# Patient Record
Sex: Female | Born: 1960 | Race: White | Hispanic: No | Marital: Married | Smoking: Never smoker
Health system: Southern US, Community
[De-identification: ages and names within clinical notes are randomized; demographics above are authoritative.]

## PROBLEM LIST (undated history)

## (undated) DIAGNOSIS — N816 Rectocele: Secondary | ICD-10-CM

## (undated) DIAGNOSIS — Z973 Presence of spectacles and contact lenses: Secondary | ICD-10-CM

## (undated) DIAGNOSIS — N200 Calculus of kidney: Secondary | ICD-10-CM

## (undated) DIAGNOSIS — I471 Supraventricular tachycardia: Secondary | ICD-10-CM

## (undated) DIAGNOSIS — K5790 Diverticulosis of intestine, part unspecified, without perforation or abscess without bleeding: Secondary | ICD-10-CM

## (undated) DIAGNOSIS — Z87442 Personal history of urinary calculi: Secondary | ICD-10-CM

## (undated) DIAGNOSIS — H35371 Puckering of macula, right eye: Secondary | ICD-10-CM

## (undated) DIAGNOSIS — E063 Autoimmune thyroiditis: Secondary | ICD-10-CM

## (undated) DIAGNOSIS — E039 Hypothyroidism, unspecified: Secondary | ICD-10-CM

## (undated) DIAGNOSIS — I10 Essential (primary) hypertension: Secondary | ICD-10-CM

## (undated) DIAGNOSIS — Z8619 Personal history of other infectious and parasitic diseases: Secondary | ICD-10-CM

## (undated) DIAGNOSIS — N814 Uterovaginal prolapse, unspecified: Secondary | ICD-10-CM

## (undated) DIAGNOSIS — R000 Tachycardia, unspecified: Secondary | ICD-10-CM

## (undated) DIAGNOSIS — H18839 Recurrent erosion of cornea, unspecified eye: Secondary | ICD-10-CM

## (undated) DIAGNOSIS — E785 Hyperlipidemia, unspecified: Secondary | ICD-10-CM

## (undated) DIAGNOSIS — I341 Nonrheumatic mitral (valve) prolapse: Secondary | ICD-10-CM

## (undated) DIAGNOSIS — H93A9 Pulsatile tinnitus, unspecified ear: Secondary | ICD-10-CM

## (undated) DIAGNOSIS — Z87448 Personal history of other diseases of urinary system: Secondary | ICD-10-CM

## (undated) DIAGNOSIS — I700 Atherosclerosis of aorta: Secondary | ICD-10-CM

## (undated) HISTORY — DX: Personal history of other diseases of urinary system: Z87.448

## (undated) HISTORY — DX: Hypothyroidism, unspecified: E03.9

## (undated) HISTORY — DX: Calculus of kidney: N20.0

## (undated) HISTORY — DX: Hyperlipidemia, unspecified: E78.5

## (undated) HISTORY — DX: Nonrheumatic mitral (valve) prolapse: I34.1

## (undated) HISTORY — DX: Autoimmune thyroiditis: E06.3

## (undated) HISTORY — PX: COLONOSCOPY: SHX174

## (undated) HISTORY — DX: Rectocele: N81.6

## (undated) HISTORY — DX: Personal history of other infectious and parasitic diseases: Z86.19

## (undated) HISTORY — DX: Uterovaginal prolapse, unspecified: N81.4

## (undated) HISTORY — DX: Supraventricular tachycardia: I47.1

## (undated) HISTORY — DX: Diverticulosis of intestine, part unspecified, without perforation or abscess without bleeding: K57.90

---
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
## 1979-08-19 HISTORY — PX: TONSILLECTOMY: SUR1361

## 1994-08-18 HISTORY — PX: BREAST BIOPSY: SHX20

## 1998-10-24 ENCOUNTER — Other Ambulatory Visit: Admission: RE | Admit: 1998-10-24 | Discharge: 1998-10-24 | Payer: Self-pay | Admitting: Obstetrics and Gynecology

## 1999-11-26 ENCOUNTER — Other Ambulatory Visit: Admission: RE | Admit: 1999-11-26 | Discharge: 1999-11-26 | Payer: Self-pay | Admitting: Obstetrics and Gynecology

## 2001-01-01 ENCOUNTER — Other Ambulatory Visit: Admission: RE | Admit: 2001-01-01 | Discharge: 2001-01-01 | Payer: Self-pay | Admitting: Obstetrics and Gynecology

## 2001-05-03 ENCOUNTER — Encounter: Payer: Self-pay | Admitting: Obstetrics and Gynecology

## 2001-05-03 ENCOUNTER — Ambulatory Visit (HOSPITAL_COMMUNITY): Admission: RE | Admit: 2001-05-03 | Discharge: 2001-05-03 | Payer: Self-pay | Admitting: Obstetrics and Gynecology

## 2002-07-12 ENCOUNTER — Encounter: Payer: Self-pay | Admitting: Obstetrics and Gynecology

## 2002-07-12 ENCOUNTER — Ambulatory Visit (HOSPITAL_COMMUNITY): Admission: RE | Admit: 2002-07-12 | Discharge: 2002-07-12 | Payer: Self-pay | Admitting: Obstetrics and Gynecology

## 2002-07-25 ENCOUNTER — Other Ambulatory Visit: Admission: RE | Admit: 2002-07-25 | Discharge: 2002-07-25 | Payer: Self-pay | Admitting: Obstetrics and Gynecology

## 2004-01-29 ENCOUNTER — Other Ambulatory Visit: Admission: RE | Admit: 2004-01-29 | Discharge: 2004-01-29 | Payer: Self-pay | Admitting: Obstetrics and Gynecology

## 2004-03-01 ENCOUNTER — Ambulatory Visit (HOSPITAL_COMMUNITY): Admission: RE | Admit: 2004-03-01 | Discharge: 2004-03-01 | Payer: Self-pay | Admitting: Obstetrics and Gynecology

## 2005-03-19 ENCOUNTER — Other Ambulatory Visit: Admission: RE | Admit: 2005-03-19 | Discharge: 2005-03-19 | Payer: Self-pay | Admitting: Obstetrics and Gynecology

## 2005-05-13 ENCOUNTER — Ambulatory Visit (HOSPITAL_COMMUNITY): Admission: RE | Admit: 2005-05-13 | Discharge: 2005-05-13 | Payer: Self-pay | Admitting: Obstetrics and Gynecology

## 2005-05-16 ENCOUNTER — Encounter: Admission: RE | Admit: 2005-05-16 | Discharge: 2005-05-16 | Payer: Self-pay | Admitting: Obstetrics and Gynecology

## 2005-12-17 ENCOUNTER — Encounter: Admission: RE | Admit: 2005-12-17 | Discharge: 2005-12-17 | Payer: Self-pay | Admitting: Obstetrics and Gynecology

## 2008-02-04 ENCOUNTER — Ambulatory Visit (HOSPITAL_BASED_OUTPATIENT_CLINIC_OR_DEPARTMENT_OTHER): Admission: RE | Admit: 2008-02-04 | Discharge: 2008-02-04 | Payer: Self-pay | Admitting: Obstetrics and Gynecology

## 2009-03-27 ENCOUNTER — Encounter (INDEPENDENT_AMBULATORY_CARE_PROVIDER_SITE_OTHER): Payer: Self-pay | Admitting: *Deleted

## 2009-05-30 ENCOUNTER — Encounter: Admission: RE | Admit: 2009-05-30 | Discharge: 2009-05-30 | Payer: Self-pay | Admitting: Gynecology

## 2009-09-07 ENCOUNTER — Telehealth: Payer: Self-pay | Admitting: Gastroenterology

## 2010-08-18 HISTORY — PX: CHOLECYSTECTOMY: SHX55

## 2010-09-17 NOTE — Progress Notes (Signed)
Summary: Schedule Colonoscopy  Phone Note Outgoing Call   Call placed by: Hortense Ramal CMA Duncan Dull),  September 07, 2009 2:14 PM Call placed to: Patient Summary of Call: I have left a message for patient to call back. She is due for her recall colonoscopy due to age. Her last colonoscopy was done in 2002. Initial call taken by: Hortense Ramal CMA Duncan Dull),  September 07, 2009 2:15 PM  Follow-up for Phone Call        Phone busy x 2. Will call back. Hortense Ramal CMA Duncan Dull)  September 11, 2009 10:31 AM   Patient called back and told Vallarie Mare, [facility] that she will not be having her colonoscopy until she is 50 years old. She was advised that Dr Russella Dar recommends that she have procedure now. However, she still declines to have procedure done at this time. Follow-up by: Hortense Ramal CMA Duncan Dull),  September 12, 2009 11:32 AM

## 2011-01-08 ENCOUNTER — Ambulatory Visit: Payer: Self-pay | Admitting: Gastroenterology

## 2011-02-12 ENCOUNTER — Ambulatory Visit (INDEPENDENT_AMBULATORY_CARE_PROVIDER_SITE_OTHER): Payer: Managed Care, Other (non HMO) | Admitting: Gastroenterology

## 2011-02-12 ENCOUNTER — Encounter: Payer: Self-pay | Admitting: Gastroenterology

## 2011-02-12 VITALS — BP 122/74 | HR 88 | Ht 62.0 in | Wt 120.0 lb

## 2011-02-12 DIAGNOSIS — K921 Melena: Secondary | ICD-10-CM

## 2011-02-12 DIAGNOSIS — R197 Diarrhea, unspecified: Secondary | ICD-10-CM

## 2011-02-12 MED ORDER — CLOTRIMAZOLE 1 % EX CREA: TOPICAL_CREAM | Freq: Two times a day (BID) | CUTANEOUS | Status: AC

## 2011-02-12 MED ORDER — PEG-KCL-NACL-NASULF-NA ASC-C 100 G PO SOLR: 1.0000 | Freq: Once | ORAL | Status: DC

## 2011-02-12 NOTE — Progress Notes (Signed)
History of Present Illness: This is a 50 -year-old female with 2 months of intermittent mucous per rectum, rectal bleeding and rectal itching and irritation that generally occurs while walking. Has had intermittent diarrhea for 2 weeks. Does not note BRBPR with bowel movements. She has noted a perianal rash for several weeks it is recently improved. Her rectal bleeding and irritation has also recently improved. She has a rectocele being followed by her gynecologist.  Review of Systems: Pertinent positive and negative review of systems were noted in the above HPI section. All other review of systems were otherwise negative.  Current Medications, Allergies, Past Medical History, Past Surgical History, Family History and Social History were reviewed in [employer] record.  Physical Exam: General: Well developed , well nourished, no acute distress Head: Normocephalic and atraumatic Eyes:  sclerae anicteric, EOMI Ears: Normal auditory acuity Mouth: No deformity or lesions Neck: Supple, no masses or thyromegaly Lungs: Clear throughout to auscultation Heart: Regular rate and rhythm; no murmurs, rubs or bruits Abdomen: Soft, non tender and non distended. No masses, hepatosplenomegaly or hernias noted. Normal Bowel sounds Rectal: perianal erythematous rash c/w candida, DRE shows no lesions and Hemoccult negative stool Musculoskeletal: Symmetrical with no gross deformities  Skin: No lesions on visible extremities Pulses:  Normal pulses noted Extremities: No clubbing, cyanosis, edema or deformities noted Neurological: Alert oriented x 4, grossly nonfocal Cervical Nodes:  No significant cervical adenopathy Inguinal Nodes: No significant inguinal adenopathy Psychological:  Alert and cooperative. Normal mood and affect  Assessment and Recommendations:  1. Perianal dermatitis is the likely cause of her rectal irritation and bleeding. I suspect this is candida. Begin Lotrimin for  10 days. I have advised her to discontinue all other creams and ointments and to leave this area dry. Consider the use of Desitin when necessary. Need to rule out proctitis, colorectal neoplasms, hemorrhoids and other disorders. She is due for a 10 year screening colonoscopy at this time anyway. Schedule colonoscopy. The risks, benefits, and alternatives to colonoscopy with possible biopsy and possible polypectomy were discussed with the patient and they consent to proceed.   2. Rectocele. I do not feel that her current symptoms symptoms related to this problem.

## 2011-02-12 NOTE — Patient Instructions (Signed)
You have been scheduled for a Colonoscopy. Separate instructions given. Pick up your prep from your pharmacy also a prescription for Lotrimin cream has been sent to the pharmacy.  Stop using other creams and lotions rectally.

## 2011-02-13 ENCOUNTER — Encounter: Payer: Self-pay | Admitting: Gastroenterology

## 2011-03-03 ENCOUNTER — Telehealth: Payer: Self-pay | Admitting: Gastroenterology

## 2011-03-03 NOTE — Telephone Encounter (Signed)
Pt transferred to [facility]

## 2011-04-01 ENCOUNTER — Other Ambulatory Visit: Payer: Managed Care, Other (non HMO) | Admitting: Gastroenterology

## 2012-09-08 ENCOUNTER — Other Ambulatory Visit: Payer: Self-pay | Admitting: Obstetrics and Gynecology

## 2012-09-08 ENCOUNTER — Ambulatory Visit
Admission: RE | Admit: 2012-09-08 | Discharge: 2012-09-08 | Disposition: A | Discharge status: Home / Self Care | Payer: Managed Care, Other (non HMO) | Source: Ambulatory Visit | Attending: Obstetrics and Gynecology | Admitting: Obstetrics and Gynecology

## 2012-09-08 DIAGNOSIS — N644 Mastodynia: Secondary | ICD-10-CM

## 2012-09-26 ENCOUNTER — Encounter (HOSPITAL_BASED_OUTPATIENT_CLINIC_OR_DEPARTMENT_OTHER): Payer: Self-pay | Admitting: Emergency Medicine

## 2012-09-26 ENCOUNTER — Emergency Department (HOSPITAL_BASED_OUTPATIENT_CLINIC_OR_DEPARTMENT_OTHER)
Admission: EM | Admit: 2012-09-26 | Discharge: 2012-09-26 | Disposition: A | Discharge status: Home / Self Care | Payer: Managed Care, Other (non HMO) | Attending: Emergency Medicine | Admitting: Emergency Medicine

## 2012-09-26 DIAGNOSIS — E039 Hypothyroidism, unspecified: Secondary | ICD-10-CM | POA: Insufficient documentation

## 2012-09-26 DIAGNOSIS — I10 Essential (primary) hypertension: Secondary | ICD-10-CM | POA: Insufficient documentation

## 2012-09-26 DIAGNOSIS — Z79899 Other long term (current) drug therapy: Secondary | ICD-10-CM | POA: Insufficient documentation

## 2012-09-26 DIAGNOSIS — Z8719 Personal history of other diseases of the digestive system: Secondary | ICD-10-CM | POA: Insufficient documentation

## 2012-09-26 DIAGNOSIS — Z8619 Personal history of other infectious and parasitic diseases: Secondary | ICD-10-CM | POA: Insufficient documentation

## 2012-09-26 DIAGNOSIS — R0602 Shortness of breath: Secondary | ICD-10-CM | POA: Insufficient documentation

## 2012-09-26 DIAGNOSIS — E785 Hyperlipidemia, unspecified: Secondary | ICD-10-CM | POA: Insufficient documentation

## 2012-09-26 DIAGNOSIS — R079 Chest pain, unspecified: Secondary | ICD-10-CM | POA: Insufficient documentation

## 2012-09-26 DIAGNOSIS — R002 Palpitations: Secondary | ICD-10-CM

## 2012-09-26 DIAGNOSIS — R000 Tachycardia, unspecified: Secondary | ICD-10-CM | POA: Insufficient documentation

## 2012-09-26 DIAGNOSIS — Z862 Personal history of diseases of the blood and blood-forming organs and certain disorders involving the immune mechanism: Secondary | ICD-10-CM | POA: Insufficient documentation

## 2012-09-26 HISTORY — DX: Tachycardia, unspecified: R00.0

## 2012-09-26 HISTORY — DX: Essential (primary) hypertension: I10

## 2012-09-26 LAB — CBC WITH DIFFERENTIAL/PLATELET
Basophils Absolute: 0 10*3/uL (ref 0.0–0.1)
Eosinophils Relative: 0 % (ref 0–5)
Lymphocytes Relative: 17 % (ref 12–46)
Lymphs Abs: 1.2 10*3/uL (ref 0.7–4.0)
MCHC: 34.1 g/dL (ref 30.0–36.0)
Platelets: 183 10*3/uL (ref 150–400)
RBC: 5.1 MIL/uL (ref 3.87–5.11)
RDW: 12.8 % (ref 11.5–15.5)

## 2012-09-26 LAB — COMPREHENSIVE METABOLIC PANEL
AST: 26 U/L (ref 0–37)
Albumin: 4.5 g/dL (ref 3.5–5.2)
BUN: 7 mg/dL (ref 6–23)
CO2: 28 mEq/L (ref 19–32)
Creatinine, Ser: 0.5 mg/dL (ref 0.50–1.10)
GFR calc Af Amer: >90 mL/min (ref >90)
Sodium: 143 mEq/L (ref 135–145)
Total Bilirubin: 0.5 mg/dL (ref 0.3–1.2)

## 2012-09-26 LAB — TROPONIN I: Troponin I: <0.3 ng/mL (ref <0.30)

## 2012-09-26 MED ORDER — SODIUM CHLORIDE 0.9 % IV BOLUS (SEPSIS)
1000.0000 mL | Freq: Once | INTRAVENOUS | Status: AC
  Administered 2012-09-26: 1000 mL via INTRAVENOUS

## 2012-09-26 NOTE — ED Provider Notes (Signed)
History     CSN: [id number]  Arrival date & time 09/26/12  1327   First MD Initiated Contact with Patient 09/26/12 1459      Chief Complaint  Patient presents with  . Anxiety    (Consider location/radiation/quality/duration/timing/severity/associated sxs/prior treatment) The history is provided by the patient.  Katie Brewer is a 52 y.o. female history of hypothyroidism, anemia, hypertension, tachycardia here presenting with increased tachycardia and anxiety and shortness of breath. She says she intermittently has tachycardia for several years but over the last to 3 days it's been constant. She also has intermittent shortness of breath that's getting worse but is not exertional. She had left-sided chest pain 2 weeks ago and went to [facility] and was diagnosed with shingles and finished a course of Valtrex. No cardiac risk factors and no PE risk factors. She has been decreasing her dose of Synthroid thinking that it is causing her tachycardia.   Past Medical History  Diagnosis Date  . Hypothyroidism   . Hyperlipidemia   . Iron deficiency anemia   . Hemorrhoids   . Hypertension   . Tachycardia     Past Surgical History  Procedure Laterality Date  . Appendectomy    . Tonsillectomy      Family History  Problem Relation Age of Onset  . Diabetes type I Son   . Diabetes type II Father     and mother   . Heart disease Father   . Colon cancer Neg Hx   . Colon polyps Father   . Ovarian cancer Mother     History  Substance Use Topics  . Smoking status: Never Smoker   . Smokeless tobacco: Never Used  . Alcohol Use: No    OB History   Grav Para Term Preterm Abortions TAB SAB Ect Mult Living                  Review of Systems  Respiratory: Positive for shortness of breath.   Cardiovascular: Positive for palpitations.  All other systems reviewed and are negative.    Allergies  Demerol; Erythromycin; Penicillins; and Sulfa drugs cross reactors  Home Medications    Current Outpatient Rx  Name  Route  Sig  Dispense  Refill  . ARMOUR THYROID 60 MG tablet      Take as directed          . Cholecalciferol (VITAMIN D3) 1000 UNITS tablet   Oral   Take 1,000 Units by mouth daily.           . Coconut Oil OIL   Rectal   Place rectally. As directed          . Multiple Vitamins-Minerals (EYE VITAMINS PO)   Oral   Take by mouth daily. Complex tabs          . peg 3350 powder (MOVIPREP) 100 G SOLR   Oral   Take 1 kit (100 g total) by mouth once.   1 kit   0     BP 149/94  Temp(Src) 98.7 F (37.1 C) (Oral)  Resp 16  Ht 5\' 3"  (1.6 m)  Wt 120 lb (54.432 kg)  BMI 21.26 kg/m2  SpO2 97%  Physical Exam  Nursing note and vitals reviewed. Constitutional: She is oriented to person, place, and time. She appears well-developed.  Slightly anxious   HENT:  Head: Normocephalic.  Mouth/Throat: Oropharynx is clear and moist.  Eyes: Conjunctivae are normal. Pupils are equal, round, and reactive to light.  Neck: Normal range of motion. Neck supple.  Cardiovascular: Regular rhythm and normal heart sounds.   tachy  Pulmonary/Chest: Effort normal and breath sounds normal. No respiratory distress. She has no wheezes. She has no rales.  Abdominal: Soft. Bowel sounds are normal. She exhibits no distension. There is no tenderness. There is no rebound.  Musculoskeletal: Normal range of motion. She exhibits no edema and no tenderness.  L breast no vesicles, nontender   Neurological: She is alert and oriented to person, place, and time.  Skin: Skin is warm and dry.  Psychiatric: She has a normal mood and affect. Her behavior is normal. Judgment and thought content normal.    ED Course  Procedures (including critical care time)  Labs Reviewed  CBC WITH DIFFERENTIAL - Abnormal; Notable for the following:    Hemoglobin 15.2 (*)    All other components within normal limits  COMPREHENSIVE METABOLIC PANEL - Abnormal; Notable for the following:     Glucose, Bld 102 (*)    All other components within normal limits  TROPONIN I  D-DIMER, QUANTITATIVE   No results found.   No diagnosis found.   Date: 09/26/2012  Rate: 108  Rhythm: sinus tachycardia  QRS Axis: normal  Intervals: normal  ST/T Wave abnormalities: normal  Conduction Disutrbances:none  Narrative Interpretation:   Old EKG Reviewed: none available    MDM  Katie Brewer is a 52 y.o. female here with tachycardia, SOB. Consider anxiety vs anemia vs PE. Will get labs and d-dimer. She had CXR 2 weeks ago that was nl and doesn't want another xray but she is not having symptoms of pneumonia.   4:15 PM D-dimer nl. Not anemic. Trop neg x 1 and symptoms for more than a day. She doesn't appear anxious. Tachycardia improved (from 111 to 105 on d/c). I told her to get her TSH checked with her doctor to adjust her armour thyroid dose.   Richardean Canal, MD 09/26/12 [phone]

## 2012-09-26 NOTE — ED Notes (Signed)
Per EMS:  Pt called regarding anxiety and shaking.  Recently told by MD with shingles and tachycardia.  No acute distress noted.

## 2012-12-09 ENCOUNTER — Other Ambulatory Visit (INDEPENDENT_AMBULATORY_CARE_PROVIDER_SITE_OTHER): Payer: Managed Care, Other (non HMO) | Admitting: *Deleted

## 2012-12-09 DIAGNOSIS — R42 Dizziness and giddiness: Secondary | ICD-10-CM

## 2012-12-10 ENCOUNTER — Encounter: Payer: Self-pay | Admitting: Cardiology

## 2013-05-18 ENCOUNTER — Encounter: Payer: Self-pay | Admitting: Internal Medicine

## 2013-05-18 ENCOUNTER — Ambulatory Visit (INDEPENDENT_AMBULATORY_CARE_PROVIDER_SITE_OTHER): Payer: Managed Care, Other (non HMO) | Admitting: Internal Medicine

## 2013-05-18 VITALS — BP 150/90 | HR 114 | Resp 16 | Ht 63.0 in | Wt 125.0 lb

## 2013-05-18 DIAGNOSIS — Z780 Asymptomatic menopausal state: Secondary | ICD-10-CM

## 2013-05-18 DIAGNOSIS — N951 Menopausal and female climacteric states: Secondary | ICD-10-CM

## 2013-05-18 DIAGNOSIS — R000 Tachycardia, unspecified: Secondary | ICD-10-CM | POA: Insufficient documentation

## 2013-05-18 DIAGNOSIS — I10 Essential (primary) hypertension: Secondary | ICD-10-CM

## 2013-05-18 DIAGNOSIS — E063 Autoimmune thyroiditis: Secondary | ICD-10-CM | POA: Insufficient documentation

## 2013-05-18 NOTE — Patient Instructions (Addendum)
See me in 2-3 weeks

## 2013-05-18 NOTE — Progress Notes (Signed)
Subjective:    Patient ID: Katie Brewer, female    DOB: 04-29-1961, 52 y.o.   MRN: [id number]  HPI  Katie Brewer is here as a new pt for first visit. She is here for a consultation.   [facility] [place] who she wishes to keep  PMH of hashimotos thyroiditis diagnosed 10 years ago, menopause, and  Tachycardia syndrome.  She is here for an opinion about her tachycardia.  She experienced first episode in Jan 2014 during what she was told was shingles.  She says she had rapid palpitations and had surges of episodic high blood pressure.  She went to see Dr. Donnie Aho in the spring who per her report did and ECHO and a HOlter and was told she had benign tachycardia.  She was given metroprolol but could not tolerate daily dose as her BP dropped to low.   Tachycardia episodes at times associated with flushing,  No recurrent rash,  No diarrhea.  Her blood pressure will sometimes elevate during the tachycardia but not always  She was wondering if her symptoms were due to menopause and her primary MD placed her on progesterone creme which she has been using for the past 2 months.  Progesterone has not helped any of her symptoms.  Her LMP was in October of 2010.  She really does not describe vasomotor flushes   Allergies  Allergen Reactions  . Demerol   . Erythromycin   . Penicillins   . Sulfa Drugs Cross Reactors    Past Medical History  Diagnosis Date  . Hypothyroidism   . Hyperlipidemia   . Iron deficiency anemia   . Hemorrhoids   . Hypertension   . Tachycardia    Past Surgical History  Procedure Laterality Date  . Appendectomy    . Tonsillectomy    . Hymenotomy / hymenectomy     History   Social History  . Marital Status: Married    Spouse Name: N/A    Number of Children: 2  . Years of Education: N/A   Occupational History  . Homemaker/Teacher    Social History Main Topics  . Smoking status: Never Smoker   . Smokeless tobacco: Never  Used  . Alcohol Use: No  . Drug Use: No  . Sexual Activity: Not on file   Other Topics Concern  . Not on file   Social History Narrative   Caffeine daily--tea    Family History  Problem Relation Age of Onset  . Diabetes type I Son   . Diabetes type II Father     and mother   . Heart disease Father   . Colon cancer Neg Hx   . Colon polyps Father   . Ovarian cancer Mother    There are no active problems to display for this patient.  Current Outpatient Prescriptions on File Prior to Visit  Medication Sig Dispense Refill  . ARMOUR THYROID 60 MG tablet Take as directed       . Cholecalciferol (VITAMIN D3) 1000 UNITS tablet Take 1,000 Units by mouth daily.        . Coconut Oil OIL Place rectally. As directed       . Multiple Vitamins-Minerals (EYE VITAMINS PO) Take by mouth daily. Complex tabs       . peg 3350 powder (MOVIPREP) 100 G SOLR Take 1 kit (100 g total) by mouth once.  1 kit  0   No current facility-administered medications on file prior to  visit.      Review of Systems See HPI    Objective:   Physical Exam  Physical Exam  Nursing note and vitals reviewed.  Constitutional: She is oriented to person, place, and time. She appears well-developed and well-nourished.  HENT:  Head: Normocephalic and atraumatic.  Cardiovascular: Normal rate and regular rhythm. Exam reveals no gallop and no friction rub.  No murmur heard.  Pulmonary/Chest: Breath sounds normal. She has no wheezes. She has no rales.  Neurological: She is alert and oriented to person, place, and time.  Skin: Skin is warm and dry.  Psychiatric: She has a normal mood and affect. Her behavior is normal.       Assessment & Plan:  S/S complex  Tachycardia, episodic Blood pressure elevation,  Rare flushing  I counseled pt that she is clearly post menopausal but I do not believe that HT will help her symptoms.  She really does not describe vasomotor flushing.  Will check aldosterone and plasma  metanephrines and catecholamines..  Check free thyroid levels.    ADvised her to take low dose metroprolol  (she can tolerate 1/4 tablet)   as her tachycardia now occurs daily   See me in 2-3 weeks

## 2013-05-19 LAB — COMPREHENSIVE METABOLIC PANEL
CO2: 33 mEq/L — ABNORMAL HIGH (ref 19–32)
Chloride: 101 mEq/L (ref 96–112)
Creat: 0.68 mg/dL (ref 0.50–1.10)
Glucose, Bld: 129 mg/dL — ABNORMAL HIGH (ref 70–99)
Potassium: 4 mEq/L (ref 3.5–5.3)
Total Protein: 7.2 g/dL (ref 6.0–8.3)

## 2013-05-19 LAB — T4, FREE: Free T4: 0.82 ng/dL (ref 0.80–1.80)

## 2013-05-19 LAB — T3, FREE: T3, Free: 3.4 pg/mL (ref 2.3–4.2)

## 2013-05-21 LAB — METANEPHRINES, PLASMA
Metanephrine, Free: 29 pg/mL (ref <=57)
Normetanephrine, Free: 136 pg/mL (ref <=148)

## 2013-05-23 ENCOUNTER — Encounter: Payer: Self-pay | Admitting: *Deleted

## 2013-05-24 LAB — CATECHOLAMINES, FRACTIONATED, PLASMA
Catecholamines, Total: 616 pg/mL
Epinephrine: 64 pg/mL
Norepinephrine: 552 pg/mL

## 2013-05-24 LAB — ALDOSTERONE: Aldosterone, Serum: 2 ng/dL

## 2013-05-25 ENCOUNTER — Encounter: Payer: Self-pay | Admitting: *Deleted

## 2013-06-06 ENCOUNTER — Ambulatory Visit (INDEPENDENT_AMBULATORY_CARE_PROVIDER_SITE_OTHER): Payer: Managed Care, Other (non HMO) | Admitting: Internal Medicine

## 2013-06-06 ENCOUNTER — Encounter: Payer: Self-pay | Admitting: Internal Medicine

## 2013-06-06 VITALS — BP 134/88 | HR 106 | Temp 97.1°F | Resp 16 | Wt 126.0 lb

## 2013-06-06 DIAGNOSIS — E063 Autoimmune thyroiditis: Secondary | ICD-10-CM

## 2013-06-06 DIAGNOSIS — Z780 Asymptomatic menopausal state: Secondary | ICD-10-CM

## 2013-06-06 DIAGNOSIS — N951 Menopausal and female climacteric states: Secondary | ICD-10-CM

## 2013-06-06 DIAGNOSIS — R000 Tachycardia, unspecified: Secondary | ICD-10-CM

## 2013-06-06 NOTE — Progress Notes (Signed)
Subjective:    Patient ID: Katie Brewer, female    DOB: June 19, 1961, 52 y.o.   MRN: [id number]  HPI  [name]  Is here for follow up  She attempted to take 1/4 dose of her metoprolol that was recommended by Dr. Donnie Aho but states she could not tolerate  It made her ankle swell.  She brings a two page list of her blood pressures taken at multiple times during the day.  By far the majority of the time her BP is normal  . She had only 4 episodes  Where her SBP was slightly over 140.    She has not taken her progesterone creme and she tells me it did not help her anyway.  All labs are normal with normal aldosterone     She seems most concerned about her heart "pounding in my ear" .  She asks if she should take progesterone cream as it "helped her sleep"     Allergies  Allergen Reactions  . Demerol   . Erythromycin   . Penicillins   . Sulfa Drugs Cross Reactors   . Thimerosal    Past Medical History  Diagnosis Date  . Hypothyroidism   . Hyperlipidemia   . Iron deficiency anemia   . Hemorrhoids   . Hypertension   . Tachycardia    Past Surgical History  Procedure Laterality Date  . Appendectomy    . Tonsillectomy    . Hymenotomy / hymenectomy     History   Social History  . Marital Status: Married    Spouse Name: N/A    Number of Children: 2  . Years of Education: N/A   Occupational History  . Homemaker/Teacher    Social History Main Topics  . Smoking status: Never Smoker   . Smokeless tobacco: Never Used  . Alcohol Use: No  . Drug Use: No  . Sexual Activity: Not on file   Other Topics Concern  . Not on file   Social History Narrative   Caffeine daily--tea    Family History  Problem Relation Age of Onset  . Diabetes type I Son   . Diabetes type II Father     and mother   . Heart disease Father   . Colon cancer Neg Hx   . Colon polyps Father   . Ovarian cancer Mother    Patient Active Problem List   Diagnosis Date Noted  . Tachycardia,  unspecified 05/18/2013  . Menopause 05/18/2013  . Hashimoto's thyroiditis 05/18/2013   Current Outpatient Prescriptions on File Prior to Visit  Medication Sig Dispense Refill  . ARMOUR THYROID 60 MG tablet Take as directed       . Cholecalciferol (VITAMIN D3) 1000 UNITS tablet Take 1,000 Units by mouth daily.        Marland Kitchen co-enzyme Q-10 30 MG capsule Place 60 mg under the tongue 3 (three) times daily.      . Inositol POWD 500 mg by Does not apply route at bedtime.      . Magnesium 300 MG CAPS Take 300 mg by mouth.      . Melatonin 5 MG SUBL Place 5 mg under the tongue.      . Misc Natural Products (DEEP SLEEP) LIQD Take 30 drops by mouth at bedtime.      . chlorpheniramine-HYDROcodone (TUSSIONEX) 10-8 MG/5ML LQCR       . Coconut Oil OIL Place rectally. As directed       . UNABLE TO FIND Apply 20  mg topically at bedtime. Bioidentical Progesterone Cream       No current facility-administered medications on file prior to visit.      Review of Systems See HPI    Objective:   Physical Exam Physical Exam  Nursing note and vitals reviewed.  Constitutional: She is oriented to person, place, and time. She appears well-developed and well-nourished.  HENT:  Head: Normocephalic and atraumatic.  Cardiovascular: Normal rate and regular rhythm. Exam reveals no gallop and no friction rub.  No murmur heard.  Pulmonary/Chest: Breath sounds normal. She has no wheezes. She has no rales.  Neurological: She is alert and oriented to person, place, and time.  Skin: Skin is warm and dry.  Psychiatric: She has a normal mood and affect. Her behavior is normal.             Assessment & Plan:  Subjective  Tachycardia  Pounding in ears  I advised she is to discuss with primary MD about progesterone treatment but in my opinion  Progesterone is not indicated for insomnia.   I also advised she may want a second opinion from cardiology at a tertiary care center.  She is to discuss with [facility]  I will be happy to see her as needed for any further consultation

## 2013-06-06 NOTE — Patient Instructions (Signed)
See me as needed  Discuss referral with your primary MD

## 2014-01-24 DIAGNOSIS — I341 Nonrheumatic mitral (valve) prolapse: Secondary | ICD-10-CM | POA: Insufficient documentation

## 2014-01-24 DIAGNOSIS — R002 Palpitations: Secondary | ICD-10-CM | POA: Insufficient documentation

## 2014-07-07 ENCOUNTER — Other Ambulatory Visit: Payer: Self-pay

## 2014-07-07 DIAGNOSIS — Z1231 Encounter for screening mammogram for malignant neoplasm of breast: Secondary | ICD-10-CM

## 2014-07-17 ENCOUNTER — Ambulatory Visit
Admission: RE | Admit: 2014-07-17 | Discharge: 2014-07-17 | Disposition: A | Discharge status: Home / Self Care | Payer: Managed Care, Other (non HMO) | Source: Ambulatory Visit

## 2014-07-17 DIAGNOSIS — Z1231 Encounter for screening mammogram for malignant neoplasm of breast: Secondary | ICD-10-CM

## 2015-06-27 DIAGNOSIS — M7502 Adhesive capsulitis of left shoulder: Secondary | ICD-10-CM | POA: Insufficient documentation

## 2015-06-27 DIAGNOSIS — M25819 Other specified joint disorders, unspecified shoulder: Secondary | ICD-10-CM | POA: Insufficient documentation

## 2015-06-27 DIAGNOSIS — M7540 Impingement syndrome of unspecified shoulder: Secondary | ICD-10-CM | POA: Insufficient documentation

## 2015-06-27 DIAGNOSIS — M19012 Primary osteoarthritis, left shoulder: Secondary | ICD-10-CM | POA: Insufficient documentation

## 2015-09-25 ENCOUNTER — Encounter: Payer: Self-pay | Admitting: Cardiology

## 2016-04-10 ENCOUNTER — Encounter: Payer: Self-pay | Admitting: Genetic Counselor

## 2016-04-10 ENCOUNTER — Telehealth: Payer: Self-pay | Admitting: Genetic Counselor

## 2016-04-10 NOTE — Telephone Encounter (Signed)
Pt returned by call to reschedule genetic appt. to 10/5, completed intake, mailed new pt letter

## 2016-05-19 ENCOUNTER — Telehealth: Payer: Self-pay | Admitting: Genetic Counselor

## 2016-05-19 ENCOUNTER — Encounter: Payer: Self-pay | Admitting: Genetic Counselor

## 2016-05-19 NOTE — Telephone Encounter (Signed)
Lauren informed me that a letter was sent by the patient and brought down for Kayla on 05/16/2016.  We were out of the office that day.  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  I left a VM on the patient's phone indicating who I was, that Lonn Georgia was out of the office on Friday and today, she will be back tomorrow but in clinic.  I left my phone number, as well as Kayla's direct line so that she may call and speak to [place] in person.

## 2016-05-19 NOTE — Telephone Encounter (Signed)
Discussed her note.  Explained our GC process.  Patients come in to discuss their personal and family history of cancer.  Based on this history will determine whether they meet medical criteria for genetic testing. Those who meet criteria will be offered genetic testing and sent for labs.  IF they decline testing then the lab will be cancelled.  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  IF this was tested outside of our office and through [facility], it may or may not be difficult to learn if they have reclassified this variant.  If it was through other labs, it is typically easier to learn about reclassification of variants.  We discussed that I don't know how well each insurance carrier covers genetic counseling.  She has [employer] which should cover genetic counseling.  We charge around $120/30 minute session and therefore she could be billed $240. Explained that all results are given over phone, unless an alternate way is discussed ahead of time.  If she is positive, we typically bring her back in for genetic counseling.  If she has a VUS or is negative, then we report over the phone.  She can get a copy of the result if she wishes. Discussed insurance discrimination, and that the chance at this time is low based on GINA and the ACA. However, if either the ACA or GINA is changed, then there could be higher risks.  That said, many insurance companies will cover testing because they prefer prevention rather than covering cancer treatments.  Patient voiced her understanding and will plan on coming in on Thursday.

## 2016-05-21 ENCOUNTER — Other Ambulatory Visit: Payer: Managed Care, Other (non HMO)

## 2016-05-21 ENCOUNTER — Encounter: Payer: Managed Care, Other (non HMO) | Admitting: Genetic Counselor

## 2016-05-22 ENCOUNTER — Ambulatory Visit (HOSPITAL_BASED_OUTPATIENT_CLINIC_OR_DEPARTMENT_OTHER): Payer: Managed Care, Other (non HMO) | Admitting: Genetic Counselor

## 2016-05-22 ENCOUNTER — Encounter: Payer: Self-pay | Admitting: Oncology

## 2016-05-22 ENCOUNTER — Other Ambulatory Visit: Payer: Managed Care, Other (non HMO)

## 2016-05-22 DIAGNOSIS — Z8043 Family history of malignant neoplasm of testis: Secondary | ICD-10-CM

## 2016-05-22 DIAGNOSIS — Z803 Family history of malignant neoplasm of breast: Secondary | ICD-10-CM | POA: Diagnosis not present

## 2016-05-22 DIAGNOSIS — Z801 Family history of malignant neoplasm of trachea, bronchus and lung: Secondary | ICD-10-CM

## 2016-05-22 DIAGNOSIS — Z8041 Family history of malignant neoplasm of ovary: Secondary | ICD-10-CM

## 2016-05-22 DIAGNOSIS — Z808 Family history of malignant neoplasm of other organs or systems: Secondary | ICD-10-CM

## 2016-05-22 DIAGNOSIS — Z800 Family history of malignant neoplasm of digestive organs: Secondary | ICD-10-CM | POA: Diagnosis not present

## 2016-05-22 DIAGNOSIS — Z85828 Personal history of other malignant neoplasm of skin: Secondary | ICD-10-CM

## 2016-05-22 DIAGNOSIS — Z315 Encounter for genetic counseling: Secondary | ICD-10-CM

## 2016-05-22 DIAGNOSIS — Z8489 Family history of other specified conditions: Secondary | ICD-10-CM

## 2016-05-23 ENCOUNTER — Encounter: Payer: Self-pay | Admitting: Genetic Counselor

## 2016-05-23 DIAGNOSIS — Z803 Family history of malignant neoplasm of breast: Secondary | ICD-10-CM | POA: Insufficient documentation

## 2016-05-23 DIAGNOSIS — Z8041 Family history of malignant neoplasm of ovary: Secondary | ICD-10-CM | POA: Insufficient documentation

## 2016-05-23 NOTE — Progress Notes (Signed)
REFERRING PROVIDER: Arvella Nigh, MD  PRIMARY PROVIDER:  Mylinda Latina, MD  PRIMARY REASON FOR VISIT:  1. Family history of ovarian cancer   2. Family history of breast cancer in female   3. Family history- stomach cancer   4. Family history of neuroblastoma   5. Family history of lung cancer   6. History of skin cancer   7. Family history of nonmelanoma skin cancer   8. Family history of testicular cancer      HISTORY OF PRESENT ILLNESS:   Ms. Rozas, a 55 y.o. female, was seen for a [place] cancer genetics consultation at the request of Dr. Radene Knee due to a family history of ovarian, breast, and other cancers.  Ms. Poirier presents to clinic today with her husband to discuss the possibility of a hereditary predisposition to cancer, genetic testing, and to further clarify her future cancer risks, as well as potential cancer risks for family members.    Ms. Bevilacqua had genetic testing of the BRCA1/2 genes (sequencing w/ 5-site rearrangement analysis) in 2012-2013 through [facility] and this test found a variant of uncertain significance (VUS) called "c.1075A>G (N319S)" in one copy of the BRCA1 gene.  Testing was otherwise negative for any known pathogenic mutations and any additional VUSes.  Ms. Polka is also hoping to learn whether or not any updates have been made to this test result.  Ms. Nachtigal has a personal history of basal and squamous cell carcinomas which she attributes to her history of sun exposure.  She sees a dermatologist regularly.  She has had no other personal history of cancer.   HORMONAL RISK FACTORS:  Menarche was at age 69.  First live birth at age 23.  OCP use for approximately 2 months.  Ovaries intact: yes.  Hysterectomy: no.  Menopausal status: postmenopausal.  HRT use: takes very low dose of natural hormone that she refers to as 'bioidentical pellets'; has taken these for 4-5 years. Colonoscopy: yes, in 2012; normal; is on a 10-year  schedule Mammogram within the last year: gets every 2 years for fear of radiation exposure. Number of breast biopsies: 1 - history of benign lumpectomy at the age of 15 (fibroadenoma) Up to date with pelvic exams:  yes. Any excessive radiation exposure/other exposures in the past:  Reports some history of secondhand smoke exposure  Past Medical History:  Diagnosis Date  . Hemorrhoids   . Hyperlipidemia   . Hypertension   . Hypothyroidism   . Iron deficiency anemia   . Tachycardia     Past Surgical History:  Procedure Laterality Date  . APPENDECTOMY    . HYMENOTOMY / HYMENECTOMY    . TONSILLECTOMY      Social History   Social History  . Marital status: Married    Spouse name: N/A  . Number of children: 2  . Years of education: N/A   Occupational History  . Homemaker/Teacher    Social History Main Topics  . Smoking status: Never Smoker  . Smokeless tobacco: Never Used  . Alcohol use No  . Drug use: No  . Sexual activity: Not Asked   Other Topics Concern  . None   Social History Narrative   Caffeine daily--tea      FAMILY HISTORY:  We obtained a detailed, 4-generation family history.  Significant diagnoses are listed below: Family History  Problem Relation Age of Onset  . Diabetes type I Son   . Diabetes type II Father     and mother   .  Heart disease Father   . High blood pressure Father   . Skin cancer Father     non-melanoma  . Ovarian cancer Mother 74  . Skin cancer Mother     non-melanoma  . Renal cancer Sister     died of neuroblastoma at age 10-11y; diagnosed 1 year before  . Breast cancer Maternal Aunt 69  . Lung cancer Maternal Uncle     not a smoker; worked around tobacco  . Stomach cancer Paternal Uncle 78  . Testicular cancer Paternal Uncle     dx. 30s  . Heart attack Maternal Grandfather     d. 70s  . Kidney Stones Paternal Grandmother     had to have a kidney removed  . Stroke Paternal Grandfather     d. 54  . Heart attack  Paternal Grandfather     smoker  . Other Daughter     gluten sensitivity  . Heart disease Maternal Uncle     d. 70s  . Breast cancer Cousin     maternal 1st cousin dx breast cancer at 61-62; w/ mets  . Colon cancer Neg Hx     Ms. Riggle has one son and one daughter, ages 25 and 22.  She has one full sister who was diagnosed with a neuroblastoma and passed away approximately one year later at the age of 10-11.  She has one full brother who is currently 44 and who has never had cancer.  Ms. Hardiman's mother was diagnosed with ovarian cancer at 74 and she passed away at 75.  She also had a history of non-melanoma skin cancer.  Ms. Brugger's father is currently 81 and he has a history of non-melanoma skin cancer, as well as diabetes, heart disease, and high blood pressure.   Ms. Grieb's mother had two full brothers and one full sister.  Her sister was diagnosed with breast cancer at 69 and she passed away from dementia-related causes in her 80s.  She had two daughter, one of whom was recently diagnosed with metastatic breast cancer at the age of 61-62.  One brother died of heart disease in his 70s.  The other brother died of lung cancer in his 80s.  Ms. Diantonio has limited contact with some of her cousins.  Her maternal grandmother died at 93.  She had a sister who had a history of stomach cancer, diagnosed at a later age in life.  Ms. Ahlin's maternal grandfather died of a heart attack in his 70s.  She has no further information for other maternal great aunts/uncles and great grandparents.    Ms. Bayliss's father has one full brother who was diagnosed with stomach cancer at 78.  He was also diagnosed with testicular cancer in his 30s.  He has one son who has never been diagnosed with cancer.  Ms. Daigler's paternal grandmother died at 89 and never had cancer.  Many of her siblings passed away from COPD, stroke, or heart-related causes.  Ms. Ceniceros's paternal grandfather was a smoker and he died of a stroke  and heart attack at 54.    Ms. Garbett is unaware of any family history of genetic testing for hereditary cancer risks.  Patient's maternal ancestors are of English descent, and paternal ancestors are of English and Dutch descent. There is no reported Ashkenazi Jewish ancestry. There is no known consanguinity.  GENETIC COUNSELING ASSESSMENT: Kiah H Trethewey is a 55 y.o. female with a family history of ovarian and breast cancer which is somewhat   suggestive of a hereditary cancer syndrome and predisposition to cancer. We, therefore, discussed and recommended the following at today's visit.   DISCUSSION: We reviewed the characteristics, features and inheritance patterns of hereditary cancer syndromes, particularly those caused by mutations within the PALB2, CHEK2, BRIP1, and Lynch syndrome genes. We also discussed genetic testing, including the appropriate family members to test, the process of testing, insurance coverage and turn-around-time for results. We discussed the implications of a negative, positive and/or variant of uncertain significant result. We recommended Ms. Nadara Mustard pursue genetic testing for the 28-gene myRisk Hereditary Cancer Panel through [facility].  The [facility] gene panel offered by [facility] includes sequencing and deletion/duplication testing of the following 28 genes: APC, ATM, BARD1, BMPR1A, BRCA1, BRCA2, BRIP1, CHD1, CDK4, CDKN2A, CHEK2, EPCAM (large rearrangement only), GREM1/SCG5, MLH1, MSH2, MSH6, MUTYH, NBN, PALB2, PMS2, POLD1, POLE, PTEN, RAD51C, RAD51D, SMAD4, STK11, and TP53.   I contacted [facility] to determine whether they have updated Ms. Mccay's VUS, and they have not done so yet.  To date, they have only seen it eight times.  Previously, [facility] had offered to test Ms. Wiltsie's father for free to determine whether this VUS was inherited from him or from Ms. Brouillard's mother (who has passed away).  Ms. Barriere reports  that her father would be interested in this testing if it's free.  Thus, I will follow up with [facility] to see if they are still willing to offer him this testing free of charge and I will get back in touch with Ms. Pergola.  Based on Ms. Roache's family history of cancer, she meets medical criteria for updated genetic testing. Despite that she meets criteria, she may still have an out of pocket cost. We discussed that if her out of pocket cost for testing is over $100, the laboratory will call and confirm whether she wants to proceed with testing.  If the out of pocket cost of testing is less than $100 she will be billed by the genetic testing laboratory.   Based on the patient's personal and family history, a statistical model (IBIS/Tyrer-Cuzick)  and literature data were used to estimate her risk of developing breast cancer. This estimates her lifetime risk of developing breast cancer to be approximately 17.8%. This estimation does not take into account any genetic testing results.  The patient's lifetime breast cancer risk is a preliminary estimate based on available information using one of several models endorsed by the [place] (ACS). The ACS recommends consideration of breast MRI screening as an adjunct to mammography for patients at high risk (defined as 20% or greater lifetime risk). A more detailed breast cancer risk assessment can be considered, if clinically indicated.   PLAN: After considering the risks, benefits, and limitations, Ms. Nadara Mustard  provided informed consent to pursue genetic testing and the blood sample was sent to [facility] for analysis of the 28-gene [facility] Hereditary Cancer Panel. Results should be available within approximately 2-3 weeks' time, at which point they will be disclosed by telephone to Ms. Kneebone, as will any additional recommendations warranted by these results. Ms. Castleman will receive a summary of her genetic counseling visit and a copy  of her results once available. This information will also be available in Epic. We encouraged Ms. Blasdel to remain in contact with cancer genetics annually so that we can continuously update the family history and inform her of any changes in cancer genetics and testing that may be of benefit for  her family. Ms. Estrella's questions were answered to her satisfaction today. Our contact information was provided should additional questions or concerns arise.  Thank you for the referral and allowing us to share in the care of your patient.    , MS, CGC Certified Genetic Counselor [email or web address] Phone: [phone]  The patient was seen for a total of 60 minutes in face-to-face genetic counseling.  This patient was discussed with Drs. Magrinat, Gudena and/or Feng who agrees with the above.    _______________________________________________________________________ For Office Staff:  Number of people involved in session: 2 Was an Intern/ student involved with case: no   

## 2016-05-26 ENCOUNTER — Telehealth: Payer: Self-pay | Admitting: Genetic Counselor

## 2016-05-27 NOTE — Telephone Encounter (Signed)
Verified that the testing lab will call Katie Brewer if her OOP cost for genetic testing will be more than $100.  Also verified that the lab will still offer free family studies to her father to see if he also has this BRCA1 VUS. This has to be done by December 5th.  We can get him set up to do that at his doctor's office or he can set-up a blood draw here.  Katie Brewer let me know that her father is coming to visit in a couple of weeks, so she will find out specific date and would like to get him set up for a blood draw here at [facility] while he is in [place].  She will call me back.

## 2016-06-02 ENCOUNTER — Telehealth: Payer: Self-pay | Admitting: Genetic Counselor

## 2016-06-02 NOTE — Telephone Encounter (Signed)
[facility] is offering free single site testing to Ms. Casaus's father.  He wants to proceed and will be in [place] on Friday, October 20th and would like to have his blood drawn at [facility].  Ms. Kelly called to set up this appt, but he has not yet been seen in our system.  I gave her the scheduler's [facility]) phone number, so that she can call and get that set-up.  I will make sure to have a test kit in the lab that day.

## 2016-06-03 ENCOUNTER — Telehealth: Payer: Self-pay | Admitting: Genetic Counselor

## 2016-06-03 NOTE — Telephone Encounter (Signed)
Lt mess regarding scheduling father's lab appointment

## 2016-06-05 ENCOUNTER — Telehealth: Payer: Self-pay | Admitting: Genetic Counselor

## 2016-06-05 NOTE — Telephone Encounter (Incomplete)
Pt confirmed appt,

## 2016-06-06 ENCOUNTER — Other Ambulatory Visit: Payer: Managed Care, Other (non HMO)

## 2016-06-11 ENCOUNTER — Telehealth: Payer: Self-pay | Admitting: Genetic Counselor

## 2016-06-13 NOTE — Telephone Encounter (Signed)
Ms. Katie Brewer has a copy of her test results per her request, but would like to wait to discuss these in more detail until after we get the results from her father's family studies testing.  This makes absolute sense, and we will wait to discuss this information until after we get those results.

## 2016-06-18 ENCOUNTER — Encounter: Payer: Self-pay | Admitting: *Deleted

## 2016-06-19 ENCOUNTER — Encounter: Payer: Self-pay | Admitting: Cardiology

## 2016-06-30 NOTE — Telephone Encounter (Signed)
Discussed with Ms. Gowen that her genetic test results were negative for known pathogenic mutations within any of the 28 genes on the myRisk Hereditary Cancer Panel through [facility].  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 No additional VUSes were found.  We also had ordered family studies of Ms. Cargle's father to determine whether or not he had this same VUS, as his family of cancer was the less concerning.  We discussed that he was found to have this same BRCA1 gene VUS.  While this is more reassuring for us, [facility] still does not have quite enough information to update this result.  Ms. Trumbo should discuss this new information with her doctor.  She is welcome to call with any questions.  She would like a copy of her results mailed and I am happy to do that. 

## 2016-07-03 ENCOUNTER — Encounter: Payer: Self-pay | Admitting: Cardiology

## 2016-07-03 ENCOUNTER — Ambulatory Visit (INDEPENDENT_AMBULATORY_CARE_PROVIDER_SITE_OTHER): Payer: Managed Care, Other (non HMO) | Admitting: Cardiology

## 2016-07-03 VITALS — BP 124/86 | HR 101 | Ht 63.0 in | Wt 115.0 lb

## 2016-07-03 DIAGNOSIS — R002 Palpitations: Secondary | ICD-10-CM | POA: Diagnosis not present

## 2016-07-03 DIAGNOSIS — I341 Nonrheumatic mitral (valve) prolapse: Secondary | ICD-10-CM | POA: Diagnosis not present

## 2016-07-03 DIAGNOSIS — R000 Tachycardia, unspecified: Secondary | ICD-10-CM

## 2016-07-03 DIAGNOSIS — E782 Mixed hyperlipidemia: Secondary | ICD-10-CM | POA: Diagnosis not present

## 2016-07-03 NOTE — Progress Notes (Signed)
Cardiology Office Note    Date:  07/03/2016   ID:  Katie Spanglelizabeth H Mcwright, DOB Jan 24, 1961, MRN [id number]  PCP:  Jeanann LewandowskyBOZEMAN, Suzzane, MD  Cardiologist:  Tobias AlexanderKatarina Allisson Schindel, MD   Chief Complaint  Patient presents with  . New Patient (Initial Visit)    History of Present Illness:  Katie Brewer is a 55 y.o. female who is coming for evaluation of palpitations and elevated heart rate. In 2014 she was evaluated by Dr. Donnie Ahoilley, and underwent a Holter monitoring that showed inappropriate sinus tachycardia, she was started on metoprolol that made her tired with no improvement of symptoms. Her symptoms started several years ago, she states that ever since she had thyroid problem her heart rate tends to be elevated, she wears aFitBit and her baseline heart rate is always in upper 80s and increases in exercise and with stress, especially emotional stress in her heart rate jumped to 120-130 and remains in that level for about an hour before it slows down. She also had carotid duplex performed in 2014 there was completely normal. She had an echocardiogram done in 2014 with normal systolic function LVEF 60%, normal biatrial size, normal right ventricular size and function, mild mitral valve prolapse with mild regurgitation. Mild tricuspid regurgitation. Her thyroid issue is long-standing, she wasn't able to tolerate Synthroid and is currently taking Armour , she has frequent checkups for TSH the 3 and T4 with frequent adjustment of her dose that are influencing her heart rate. Her most recent TSH 7.2. She denies any dizziness shortness of breath or chest pain with episodes of palpitations. She experienced one episode of chest pain in August when she woke up from sleep with chest pressure that resolved on its own. She walks 6 times a week for 2-3 miles and he experiences no chest pain or shortness of breath. Family history is significant for coronary artery disease in her father in his early 4560s that resulted into  bypass surgery. She feels that her palpitations got worse after menopause other than anxiety she doesn't have any associated symptoms, no prior syncope. She lives extremely healthy lifestyle with LC diet, almost no carbohydrates high level of exercise. She takes it clear that she doesn't like to take any medicines. Her most recent cholesterol was HDL 75 triglyceride 68 and LDL to 129.  Past Medical History:  Diagnosis Date  . Diverticulosis   . Hemorrhoids   . History of chicken pox   . History of cystocele   . Hyperlipidemia   . Hypertension   . Hypothyroidism   . Iron deficiency anemia   . MVP (mitral valve prolapse)   . Rectocele   . SVT (supraventricular tachycardia) (HCC)   . Tachycardia   . Uterine prolapse     Past Surgical History:  Procedure Laterality Date  . APPENDECTOMY  1976  . BREAST BIOPSY Left 1996  . CHOLECYSTECTOMY  2012  . HYMENOTOMY / HYMENECTOMY    . TONSILLECTOMY  1981   Current Medications: Outpatient Medications Prior to Visit  Medication Sig Dispense Refill  . ARMOUR THYROID 60 MG tablet Take as directed     . Cholecalciferol (VITAMIN D3) 1000 UNITS tablet Take 1,000 Units by mouth daily.      . Coconut Oil OIL Place rectally. As directed     . chlorpheniramine-HYDROcodone (TUSSIONEX) 10-8 MG/5ML LQCR     . co-enzyme Q-10 30 MG capsule Place 60 mg under the tongue 3 (three) times daily.    . Inositol POWD 500 mg  by Does not apply route at bedtime.    . Magnesium 300 MG CAPS Take 300 mg by mouth.    . Melatonin 5 MG SUBL Place 5 mg under the tongue.    . Misc Natural Products (DEEP SLEEP) LIQD Take 30 drops by mouth at bedtime.    Marland Kitchen UNABLE TO FIND Apply 20 mg topically at bedtime. Bioidentical Progesterone Cream     No facility-administered medications prior to visit.      Allergies:   Meperidine; Shellfish allergy; Ampicillin; Demerol; Erythromycin; Influenza vaccines; Penicillins; Sulfa antibiotics; Sulfa drugs cross reactors; and Thimerosal    Social History   Social History  . Marital status: Married    Spouse name: N/A  . Number of children: 2  . Years of education: N/A   Occupational History  . Homemaker/Teacher    Social History Main Topics  . Smoking status: Never Smoker  . Smokeless tobacco: Never Used  . Alcohol use No  . Drug use: No  . Sexual activity: Not Asked   Other Topics Concern  . None   Social History Narrative   Caffeine daily--tea      Family History:  The patient's family history includes Breast cancer in her cousin; Breast cancer (age of onset: 67) in her maternal aunt; Diabetes type I in her son; Diabetes type II in her father; Heart attack in her maternal grandfather and paternal grandfather; Heart disease in her father and maternal uncle; High blood pressure in her father; Kidney Stones in her paternal grandmother; Lung cancer in her maternal uncle; Other in her daughter; Ovarian cancer (age of onset: 65) in her mother; Renal cancer in her sister; Skin cancer in her father and mother; Stomach cancer (age of onset: 67) in her paternal uncle; Stroke in her paternal grandfather; Testicular cancer in her paternal uncle.   ROS:   Please see the history of present illness.    ROS All other systems reviewed and are negative.   PHYSICAL EXAM:   VS:  BP 124/86 (BP Location: Right Arm, Patient Position: Sitting, Cuff Size: Normal)   Pulse (!) 101   Ht 5\' 3"  (1.6 m)   Wt 115 lb (52.2 kg)   SpO2 99%   BMI 20.37 kg/m    GEN: Well nourished, well developed, in no acute distress  HEENT: normal  Neck: no JVD, carotid bruits, or masses Cardiac: RRR; 2/6 systolic murmurs, rubs, or gallops,no edema  Respiratory:  clear to auscultation bilaterally, normal work of breathing GI: soft, nontender, nondistended, + BS MS: no deformity or atrophy  Skin: warm and dry, no rash Neuro:  Alert and Oriented x 3, Strength and sensation are intact Psych: euthymic mood, full affect  Wt Readings from Last 3  Encounters:  07/03/16 115 lb (52.2 kg)  06/06/13 126 lb (57.2 kg)  05/18/13 125 lb (56.7 kg)      Studies/Labs Reviewed:   EKG:  EKG is ordered today.  The ekg ordered today demonstrates Normal sinus rhythm with ventricular rate 96 bpm, normal EKG.  Recent Labs: No results found for requested labs within last 8760 hours.   Lipid Panel No results found for: CHOL, TRIG, HDL, CHOLHDL, VLDL, LDLCALC, LDLDIRECT  Additional studies/ records that were reviewed today include:  See history of present illness   ASSESSMENT:    1. MVP (mitral valve prolapse)   2. Palpitations   3. Inappropriate sinus tachycardia   4. Mixed hyperlipidemia     PLAN:  In order of problems listed  above:  1. Inappropriate sinus tachycardia, the patient is encouraged to continue exercising, make sure that she stays plenty hydrated every day and more when she is exercising. She is encouraged to use all electrolyte supplements. She is off work Cardiz[employer] however she makes it clear that she is not interested in taking meds as her symptoms are not significant and she can deal with them if she knows that they are not harmful. 2. Mitral all prolapse with only mild mitral regurgitation, she has minimal systolic murmur, there is no reason to repeat echocardiogram right now we'll just follow. 3. Hyperlipidemia and family history of coronary artery disease, she is already on ideal diet and exercise regimen, she is advised to start using red yeast rice 600 mg by mouth daily.  Medication Adjustments/Labs and Tests Ordered: Current medicines are reviewed at length with the patient today.  Concerns regarding medicines are outlined above.  Medication changes, Labs and Tests ordered today are listed in the Patient Instructions below. Patient Instructions  .Medication Instructions:   Your physician recommends that you continue on your current medications as directed. Please refer to the Current Medication list given to you  today.    Follow-Up:  Your physician wants you to follow-up in: ONE YEAR WITH DR Johnell ComingsNELSON You will receive a reminder letter in the mail two months in advance. If you don't receive a letter, please call our office to schedule the follow-up appointment.        If you need a refill on your cardiac medications before your next appointment, please call your pharmacy.      Signed, Tobias AlexanderKatarina Koa Zoeller, MD  07/03/2016 5:30 PM    [facility] [address], [place], [place]  [address] Phone: [phone](336) [phone]; Fax: [phone](336) [phone]

## 2016-07-03 NOTE — Patient Instructions (Signed)

## 2017-01-28 ENCOUNTER — Telehealth: Payer: Self-pay | Admitting: Cardiology

## 2017-01-28 NOTE — Telephone Encounter (Signed)
New Message     Pt states she is having erratic heartbeat after she exercise, she is feeling dizzy weird when this happens, like her heart cant slow down once it gets beating real fast

## 2017-01-28 NOTE — Telephone Encounter (Signed)
Pt calling to schedule an appt with Dr Delton SeeNelson, for complaints of feeling her heart "skipping beats" post exercise.  Pt states she exercises daily and within the last 3 weeks, has felt her heart rate/rhythm skipping beats.  Pt states she dizzy mild dizziness with this, and feels "funny." Pt states that once she's completely in her resting phase, she goes back to her normal baseline.  Scheduled the pt to come in and see Dr Delton SeeNelson on this Friday 6/15 at 0940.  Pt aware to arrive 15 mins prior to this appt.  Advised the pt to hold off on exercise, until she see's Dr Delton SeeNelson on this Friday.  Pt verbalized understanding and agrees with this plan.  Will send this message to Dr Delton SeeNelson as an Lorain ChildesFYI.

## 2017-01-30 ENCOUNTER — Encounter: Payer: Self-pay | Admitting: Cardiology

## 2017-01-30 ENCOUNTER — Ambulatory Visit (INDEPENDENT_AMBULATORY_CARE_PROVIDER_SITE_OTHER): Payer: Managed Care, Other (non HMO) | Admitting: Cardiology

## 2017-01-30 VITALS — BP 132/72 | HR 96 | Ht 63.0 in | Wt 109.0 lb

## 2017-01-30 DIAGNOSIS — R002 Palpitations: Secondary | ICD-10-CM | POA: Diagnosis not present

## 2017-01-30 DIAGNOSIS — R42 Dizziness and giddiness: Secondary | ICD-10-CM

## 2017-01-30 DIAGNOSIS — E063 Autoimmune thyroiditis: Secondary | ICD-10-CM

## 2017-01-30 DIAGNOSIS — R000 Tachycardia, unspecified: Secondary | ICD-10-CM

## 2017-01-30 DIAGNOSIS — I4711 Inappropriate sinus tachycardia, so stated: Secondary | ICD-10-CM

## 2017-01-30 NOTE — Progress Notes (Signed)
Cardiology Office Note    Date:  01/30/2017   ID:  Katie Brewer, DOB 1960/10/23, MRN [id number]  PCP:  Jeanann Lewandowsky, MD  Cardiologist:  Tobias Alexander, MD   Chief complaint: Symptomatic palpitations, dizziness.  History of Present Illness:  Katie Brewer is a 56 y.o. female who is coming for evaluation of palpitations and elevated heart rate. In 2014 she was evaluated by Dr. Donnie Aho, and underwent a Holter monitoring that showed inappropriate sinus tachycardia, she was started on metoprolol that made her tired with no improvement of symptoms. Her symptoms started several years ago, she states that ever since she had thyroid problem her heart rate tends to be elevated, she wears aFitBit and her baseline heart rate is always in upper 80s and increases in exercise and with stress, especially emotional stress in her heart rate jumped to 120-130 and remains in that level for about an hour before it slows down. She also had carotid duplex performed in 2014 there was completely normal. She had an echocardiogram done in 2014 with normal systolic function LVEF 60%, normal biatrial size, normal right ventricular size and function, mild mitral valve prolapse with mild regurgitation. Mild tricuspid regurgitation. Her thyroid issue is long-standing, she wasn't able to tolerate Synthroid and is currently taking Armour , she has frequent checkups for TSH the 3 and T4 with frequent adjustment of her dose that are influencing her heart rate. Her most recent TSH 7.2. She denies any dizziness shortness of breath or chest pain with episodes of palpitations. She experienced one episode of chest pain in August when she woke up from sleep with chest pressure that resolved on its own. She walks 6 times a week for 2-3 miles and he experiences no chest pain or shortness of breath. Family history is significant for coronary artery disease in her father in his early 59s that resulted into bypass surgery. She  feels that her palpitations got worse after menopause other than anxiety she doesn't have any associated symptoms, no prior syncope. She lives extremely healthy lifestyle with LC diet, almost no carbohydrates high level of exercise. She takes it clear that she doesn't like to take any medicines. Her most recent cholesterol was HDL 75 triglyceride 68 and LDL to 129.  01/30/2017 - 1 year follow-up, the patient has been experiencing worsening palpitations in the last 2 months, she heart rate up to 150 even at rest and with minimal exertion. She enjoys walking but is now scared as high heart rates are associated with gait instability dizziness and some blurry vision. She states that after exertion her heart rate dropped to 70s and makes her feel even more dizzy. She denies any falls or syncope. She denies any chest pain. She is being followed by her primary care physician for Hashimoto thyroiditis, she didn't tolerate Synthroid in the past and is taking Armour Thyroid 60 mg po daily. She was previously started on metoprolol that made her tired with no improvement of symptoms. Diltiazem was offered but she was not interested. She has also lost 6 pounds unintentionally she has alternating diarrhea and constipation for the last 7 years but no blood in her stool. Her hemoglobin was normal in December 2017.   Past Medical History:  Diagnosis Date  . Diverticulosis   . Hemorrhoids   . History of chicken pox   . History of cystocele   . Hyperlipidemia   . Hypertension   . Hypothyroidism   . Iron deficiency anemia   .  MVP (mitral valve prolapse)   . Rectocele   . SVT (supraventricular tachycardia) (HCC)   . Tachycardia   . Uterine prolapse     Past Surgical History:  Procedure Laterality Date  . APPENDECTOMY  1976  . BREAST BIOPSY Left 1996  . CHOLECYSTECTOMY  2012  . HYMENOTOMY / HYMENECTOMY    . TONSILLECTOMY  1981   Current Medications: Outpatient Medications Prior to Visit  Medication Sig  Dispense Refill  . ARMOUR THYROID 60 MG tablet Take as directed     . Cholecalciferol (VITAMIN D3) 1000 UNITS tablet Take 1,000 Units by mouth daily.      . Coconut Oil OIL Place rectally. As directed     . Digestive Enzymes (DIGESTIVE ENZYME PO) Take 1 tablet by mouth every morning. 4 mornings per week    . estazolam (PROSOM) 2 MG tablet Take 1 tablet by mouth at bedtime as needed.    Marland Kitchen. GLYCINE PO Take 1 tablet by mouth every morning. 4 morning per week     No facility-administered medications prior to visit.      Allergies:   Meperidine; Shellfish allergy; Ampicillin; Demerol; Erythromycin; Influenza vaccines; Penicillins; Sulfa antibiotics; Sulfa drugs cross reactors; and Thimerosal   Social History   Social History  . Marital status: Married    Spouse name: Katie Brewer  . Number of children: 2  . Years of education: Katie Brewer   Occupational History  . Homemaker/Teacher    Social History Main Topics  . Smoking status: Never Smoker  . Smokeless tobacco: Never Used  . Alcohol use No  . Drug use: No  . Sexual activity: Not Asked   Other Topics Concern  . None   Social History Narrative   Caffeine daily--tea      Family History:  The patient's family history includes Breast cancer in her cousin; Breast cancer (age of onset: 6169) in her maternal aunt; Diabetes type I in her son; Diabetes type II in her father; Heart attack in her maternal grandfather and paternal grandfather; Heart disease in her father and maternal uncle; High blood pressure in her father; Kidney Stones in her paternal grandmother; Lung cancer in her maternal uncle; Other in her daughter; Ovarian cancer (age of onset: 1174) in her mother; Renal cancer in her sister; Skin cancer in her father and mother; Stomach cancer (age of onset: 7378) in her paternal uncle; Stroke in her paternal grandfather; Testicular cancer in her paternal uncle.   ROS:   Please see the history of present illness.    ROS All other systems reviewed and  are negative.   PHYSICAL EXAM:   VS:  BP 132/72   Pulse 96   Ht 5\' 3"  (1.6 m)   Wt 109 lb (49.4 kg)   BMI 19.31 kg/m    GEN: Well nourished, well developed, in no acute distress  HEENT: normal  Neck: no JVD, carotid bruits, or masses Cardiac: RRR; 2/6 systolic murmurs, rubs, or gallops,no edema  Respiratory:  clear to auscultation bilaterally, normal work of breathing GI: soft, nontender, nondistended, + BS MS: no deformity or atrophy  Skin: warm and dry, no rash Neuro:  Alert and Oriented x 3, Strength and sensation are intact Psych: euthymic mood, full affect  Wt Readings from Last 3 Encounters:  01/30/17 109 lb (49.4 kg)  07/03/16 115 lb (52.2 kg)  06/06/13 126 lb (57.2 kg)      Studies/Labs Reviewed:   EKG:  EKG is ordered today.  The ekg ordered today  demonstrates Normal sinus rhythm with ventricular rate 96 bpm, normal EKG. Unchanged from prior, this was personally reviewed.  Recent Labs: No results found for requested labs within last 8760 hours.   Lipid Panel No results found for: CHOL, TRIG, HDL, CHOLHDL, VLDL, LDLCALC, LDLDIRECT  Additional studies/ records that were reviewed today include:  See history of present illness   ASSESSMENT:    1. Palpitations   2. Inappropriate sinus tachycardia   3. Dizziness   4. Hashimoto's thyroiditis     PLAN:  In order of problems listed above:  1. Inappropriate sinus tachycardia, the patient was encouraged to continue exercising, make sure that she stays plenty hydrated every day and more when she is exercising. She is encouraged to use all electrolyte supplements. She has done all of that however her symptoms are worsening. She had normal echocardiogram in the past. There are couple actions and need to be done 1. Obtain second opinion from an endocrinologist as her thyroid is being followed by PCP and TSH and free T3 are borderline 2. As she has alternating diarrhea constipation and unintentional weight  loss 3. Schedule an exercise treadmill stress test to assess heart rate variability during and post exercise and evaluate for any potential arrhythmias. 4. Arrange 48-hour Holter monitor to evaluate heart rate variability and the potential arrhythmias.   Medication Adjustments/Labs and Tests Ordered: Current medicines are reviewed at length with the patient today.  Concerns regarding medicines are outlined above.  Medication changes, Labs and Tests ordered today are listed in the Patient Instructions below. Patient Instructions  Medication Instructions:  Your physician recommends that you continue on your current medications as directed. Please refer to the Current Medication list given to you today.   Labwork: None ordered  Testing/Procedures: Your physician has requested that you have an exercise tolerance test. For further information please visit [email or web address] Please also follow instruction sheet, as given.  Your physician has recommended that you wear a holter monitor. Holter monitors are medical devices that record the heart's electrical activity. Doctors most often use these monitors to diagnose arrhythmias. Arrhythmias are problems with the speed or rhythm of the heartbeat. The monitor is a small, portable device. You can wear one while you do your normal daily activities. This is usually used to diagnose what is causing palpitations/syncope (passing out).    Follow-Up: You have been referred to Dr. Corwin Levins (Endocrinology)   Your physician recommends that you schedule a follow-up appointment in: 3 months (Next Available) with Dr. Delton See.    Any Other Special Instructions Will Be Listed Below (If Applicable).     If you need a refill on your cardiac medications before your next appointment, please call your pharmacy.      Signed, Tobias Alexander, MD  01/30/2017 10:39 AM    [facility] [address] [place], [place], [place]   [address] Phone: [phone]; Fax: [phone]

## 2017-01-30 NOTE — Patient Instructions (Addendum)
Medication Instructions:  Your physician recommends that you continue on your current medications as directed. Please refer to the Current Medication list given to you today.   Labwork: None ordered  Testing/Procedures: Your physician has requested that you have an exercise tolerance test. For further information please visit [email or web address]. Please also follow instruction sheet, as given.  Your physician has recommended that you wear a holter monitor. Holter monitors are medical devices that record the heart's electrical activity. Doctors most often use these monitors to diagnose arrhythmias. Arrhythmias are problems with the speed or rhythm of the heartbeat. The monitor is a small, portable device. You can wear one while you do your normal daily activities. This is usually used to diagnose what is causing palpitations/syncope (passing out).    Follow-Up: You have been referred to Dr. Corwin LevinsWilliam Smith (Endocrinology)   Your physician recommends that you schedule a follow-up appointment in: 3 months (Next Available) with Dr. Delton SeeNelson.    Any Other Special Instructions Will Be Listed Below (If Applicable).     If you need a refill on your cardiac medications before your next appointment, please call your pharmacy.

## 2017-02-03 ENCOUNTER — Ambulatory Visit: Payer: Managed Care, Other (non HMO) | Admitting: Cardiology

## 2017-02-11 ENCOUNTER — Ambulatory Visit (INDEPENDENT_AMBULATORY_CARE_PROVIDER_SITE_OTHER): Payer: Managed Care, Other (non HMO)

## 2017-02-11 DIAGNOSIS — R002 Palpitations: Secondary | ICD-10-CM | POA: Diagnosis not present

## 2017-02-11 DIAGNOSIS — R42 Dizziness and giddiness: Secondary | ICD-10-CM

## 2017-02-11 DIAGNOSIS — R000 Tachycardia, unspecified: Secondary | ICD-10-CM

## 2017-02-11 LAB — EXERCISE TOLERANCE TEST
Estimated workload: 7 METS
Exercise duration (min): 6 min
Exercise duration (sec): 0 s
MPHR: 162 {beats}/min
Peak HR: 166 {beats}/min
Percent HR: 102 %
RPE: 14
Rest HR: 100 {beats}/min

## 2017-02-12 ENCOUNTER — Encounter (HOSPITAL_COMMUNITY): Payer: Self-pay

## 2017-02-24 ENCOUNTER — Encounter: Payer: Self-pay | Admitting: Cardiology

## 2017-02-24 NOTE — Telephone Encounter (Addendum)
Copied from My Chart message sent to Dr Delton SeeNelson 02/24/17  Pt states she continues to have palpitations/head blips, heart rate 107-114 for about an hour when she was resting last night, BP varied from 145/85-115/87 during this time. Pt states she goes back to work mid-August and is anxious to feel better before she goes back to work. Pt states she is not going to be able to see endocrinologist until mid-August, has seen GI and is having testing done. Pt advised I will review with Dr Ladona Ridgelaylor.   02/24/17--Dr Ladona Ridgelaylor reviewed paper copy of  monitor done 02/11/17:  1) NSR 2) Sinus tachycardia 3) rare PAC's  Per Dr Jefferey Picaaylor--limit caffeine, regular walking, wait for review by Dr Delton SeeNelson for any further recommendations. Katie, monitor tech, states this report has dropped in [place] Dr [facility] [place] [place]. I have discussed Dr Lubertha Basqueaylor's  report and recommendations with patient. Pt is concerned about starting back walking because of head blips, pt asking if she should see neurologist.   Pt wanted to be sure Dr Delton SeeNelson was able to see pt's My Chart message sent today that I have copied in this note.   Pt aware that I am going to forward to Dr Delton SeeNelson for review.

## 2017-03-02 NOTE — Telephone Encounter (Signed)
Note    Copied from My Chart message sent to Dr Delton SeeNelson 02/24/17  Pt states she continues to have palpitations/head blips, heart rate 107-114 for about an hour when she was resting last night, BP varied from 145/85-115/87 during this time. Pt states she goes back to work mid-August and is anxious to feel better before she goes back to work. Pt states she is not going to be able to see endocrinologist until mid-August, has seen GI and is having testing done. Pt advised I will review with Dr Ladona Ridgelaylor.   02/24/17--Dr Ladona Ridgelaylor reviewed paper copy of  monitor done 02/11/17:  1) NSR 2) Sinus tachycardia 3) rare PAC's  Per Dr Jefferey Picaaylor--limit caffeine, regular walking, wait for review by Dr Delton SeeNelson for any further recommendations. Katie, monitor tech, states this report has dropped in to Dr Morgan [facility] [place]. I have discussed Dr Lubertha Basqueaylor's  report and recommendations with patient. Pt is concerned about starting back walking because of head blips, pt asking if she should see neurologist.   Pt wanted to be sure Dr Delton SeeNelson was able to see pt's My Chart message sent today that I have copied in this note.   Pt aware that I am going to forward to Dr Delton SeeNelson for review.

## 2017-03-02 NOTE — Telephone Encounter (Signed)
-----   Message from Gerome Sameborah D Miller sent at 02/23/2017 10:21 AM EDT ----- Regarding: RE: FOLLOW-UP ON REFERRAL SENT TO ENDOCRINOLOGY ON THIS PT  Lajoyce CornersIvy, I called Mrs.Hearst with her appointment,she was aware of it.She also said she  haven't received her result from the monitor and still felling bad. ----- Message ----- From: Loa SocksMartin, Ivy M, LPN Sent: 1/6/10967/01/2017   5:59 PM To: [facility] Subject: FOLLOW-UP ON REFERRAL SENT TO ENDOCRINOLOGY #  This pt was referred to Dr Katrinka BlazingSmith with Endocrinology weeks ago and they haven't called her to schedule this.  Would you guys be able to follow-up on this? I will be out of the office next week, but you can send me a staff message to review upon return to the office that next week.   Thanks so much,   [employer]

## 2017-03-26 ENCOUNTER — Emergency Department (HOSPITAL_BASED_OUTPATIENT_CLINIC_OR_DEPARTMENT_OTHER): Payer: Managed Care, Other (non HMO)

## 2017-03-26 ENCOUNTER — Emergency Department (HOSPITAL_BASED_OUTPATIENT_CLINIC_OR_DEPARTMENT_OTHER)
Admission: EM | Admit: 2017-03-26 | Discharge: 2017-03-26 | Disposition: A | Discharge status: Home / Self Care | Payer: Managed Care, Other (non HMO) | Attending: Emergency Medicine | Admitting: Emergency Medicine

## 2017-03-26 ENCOUNTER — Encounter (HOSPITAL_BASED_OUTPATIENT_CLINIC_OR_DEPARTMENT_OTHER): Payer: Self-pay | Admitting: *Deleted

## 2017-03-26 DIAGNOSIS — I10 Essential (primary) hypertension: Secondary | ICD-10-CM | POA: Diagnosis not present

## 2017-03-26 DIAGNOSIS — E039 Hypothyroidism, unspecified: Secondary | ICD-10-CM | POA: Insufficient documentation

## 2017-03-26 DIAGNOSIS — R1013 Epigastric pain: Secondary | ICD-10-CM | POA: Diagnosis not present

## 2017-03-26 DIAGNOSIS — Z79899 Other long term (current) drug therapy: Secondary | ICD-10-CM | POA: Insufficient documentation

## 2017-03-26 LAB — COMPREHENSIVE METABOLIC PANEL
ALBUMIN: 4.4 g/dL (ref 3.5–5.0)
ALK PHOS: 45 U/L (ref 38–126)
ALT: 23 U/L (ref 14–54)
AST: 24 U/L (ref 15–41)
Anion gap: 10 (ref 5–15)
BILIRUBIN TOTAL: 0.8 mg/dL (ref 0.3–1.2)
BUN: 5 mg/dL — AB (ref 6–20)
CALCIUM: 9.1 mg/dL (ref 8.9–10.3)
CO2: 29 mmol/L (ref 22–32)
Chloride: 101 mmol/L (ref 101–111)
Creatinine, Ser: 0.54 mg/dL (ref 0.44–1.00)
GFR calc Af Amer: >60 mL/min (ref >60)
GFR calc non Af Amer: >60 mL/min (ref >60)
GLUCOSE: 91 mg/dL (ref 65–99)
Potassium: 4.2 mmol/L (ref 3.5–5.1)
Sodium: 140 mmol/L (ref 135–145)
Total Protein: 6.9 g/dL (ref 6.5–8.1)

## 2017-03-26 LAB — CBC
HEMATOCRIT: 44 % (ref 36.0–46.0)
Hemoglobin: 14.6 g/dL (ref 12.0–15.0)
MCH: 29 pg (ref 26.0–34.0)
MCHC: 33.2 g/dL (ref 30.0–36.0)
MCV: 87.5 fL (ref 78.0–100.0)
Platelets: 153 10*3/uL (ref 150–400)
RBC: 5.03 MIL/uL (ref 3.87–5.11)
RDW: 12.3 % (ref 11.5–15.5)
WBC: 7 10*3/uL (ref 4.0–10.5)

## 2017-03-26 LAB — LIPASE, BLOOD: Lipase: 45 U/L (ref 11–51)

## 2017-03-26 MED ORDER — SODIUM CHLORIDE 0.9 % IV BOLUS (SEPSIS)
1000.0000 mL | Freq: Once | INTRAVENOUS | Status: AC
  Administered 2017-03-26: 1000 mL via INTRAVENOUS

## 2017-03-26 MED ORDER — SODIUM CHLORIDE 0.9 % IV SOLN: 1000.0000 mL | INTRAVENOUS | Status: DC

## 2017-03-26 NOTE — ED Triage Notes (Signed)
Abdominal pain and nausea. She had a colonoscopy and endoscopy yesterday for hx of weight loss. She called her MD and was told she may dehydrated.

## 2017-03-26 NOTE — ED Notes (Signed)
ED Provider at bedside. 

## 2017-03-26 NOTE — ED Provider Notes (Signed)
[facility]-EMERGENCY DEPT Provider Note   CSN: [id number] Arrival date & time: 03/26/17  1231     History   Chief Complaint Chief Complaint  Patient presents with  . Abdominal Pain    HPI Katie Brewer is a 56 y.o. female presenting with abdominal pain, nausea, and feeling shaky.  Patient states she had an endoscopy and colonoscopy yesterday. She was doing well after the procedure, but last night had worsening epigastric abdominal pain. This pain is intermittent, sharp, and she describes it as feeling similar to when she had pain in her gallbladder (has had cholecystectomy and appendectomy). She has no pain currently. She reports nausea, but has not vomited. Nausea is worse postprandial, however she has been experiencing this for a while, which is why she had the endoscopy. Patient reports some bloody mucus in her stool, which she was told to expect, as they took several biopsies of her colon, but she also reports "floating diarrhea" today. Symptoms, especially shakiness, are worse when she goes from sitting to standing. She denies fever, chills, chest pain, shortness of breath, or urinary symptoms.  She called her doctor's office, and was told to go to urgent care for evaluation of dehydration. Urgent care was unable to do IV fluids, so she was told to come to the emergency department. Her doctor called in a prescription for Zofran, and she states she does not want any from the ED at this time.  HPI  Past Medical History:  Diagnosis Date  . Diverticulosis   . Hemorrhoids   . History of chicken pox   . History of cystocele   . Hyperlipidemia   . Hypertension   . Hypothyroidism   . Iron deficiency anemia   . MVP (mitral valve prolapse)   . Rectocele   . SVT (supraventricular tachycardia) (HCC)   . Tachycardia   . Uterine prolapse     Patient Active Problem List   Diagnosis Date Noted  . Family history of ovarian cancer 05/23/2016  . Family history of breast cancer in female  05/23/2016  . Adhesive capsulitis of left shoulder 06/27/2015  . 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 Shoulder impingement 06/27/2015  . MVP (mitral valve prolapse) 01/24/2014  . Palpitations 01/24/2014  . Sinus tachycardia 05/18/2013  . Menopause 05/18/2013  . Hashimoto's thyroiditis 05/18/2013    Past Surgical History:  Procedure Laterality Date  . APPENDECTOMY  1976  . BREAST BIOPSY Left 1996  . CHOLECYSTECTOMY  2012  . HYMENOTOMY / HYMENECTOMY    . TONSILLECTOMY  1981    OB History    No data available       Home Medications    Prior to Admission medications   Medication Sig Start Date End Date Taking? Authorizing Provider  ARMOUR THYROID 60 MG tablet Take as directed  11/18/10   [provider]  Cholecalciferol (VITAMIN D3) 1000 UNITS tablet Take 1,000 Units by mouth daily.      [provider]  Coconut Oil OIL Place rectally. As directed     [provider]  Digestive Enzymes (DIGESTIVE ENZYME PO) Take 1 tablet by mouth every morning. 4 mornings per week    [provider]  estazolam (PROSOM) 2 MG tablet Take 1 tablet by mouth at bedtime as needed. 06/25/15   [provider]  GLYCINE PO Take 1 tablet by mouth every morning. 4 morning per week    [provider]    Family History Family History  Problem Relation  Age of Onset  . Diabetes type I Son   . Diabetes type II Father        and mother   . Heart disease Father   . High blood pressure Father   . Skin cancer Father        non-melanoma  . Ovarian cancer Mother 31  . Skin cancer Mother        non-melanoma  . Renal cancer Sister        died of neuroblastoma at age 11-11y; diagnosed 1 year before  . Breast cancer Maternal Aunt 20  . Lung cancer Maternal Uncle        not a smoker; worked around tobacco  . Stomach cancer Paternal Uncle 21  . Testicular cancer Paternal Uncle        dx. 30s  . Heart attack Maternal Grandfather        d.  52s  . Kidney Stones Paternal Grandmother        had to have a kidney removed  . Stroke Paternal Grandfather        d. 70  . Heart attack Paternal Grandfather        smoker  . Other Daughter        gluten sensitivity  . Heart disease Maternal Uncle        d. 24s  . Breast cancer Cousin        maternal 1st cousin dx breast cancer at 59-62; w/ mets  . Colon cancer Neg Hx     Social History Social History  Substance Use Topics  . Smoking status: Never Smoker  . Smokeless tobacco: Never Used  . Alcohol use No     Allergies   Meperidine; Shellfish allergy; Ampicillin; Demerol; Erythromycin; Influenza vaccines; Penicillins; Sulfa antibiotics; Sulfa drugs cross reactors; and Thimerosal   Review of Systems Review of Systems  Gastrointestinal: Positive for abdominal pain, blood in stool, diarrhea and nausea.  Neurological:       Feeling shaky      Physical Exam Updated Vital Signs BP 139/86 (BP Location: Right Arm)   Pulse 95   Temp 98.4 F (36.9 C) (Oral)   Resp 18   Ht 5' 2.5" (1.588 m)   Wt 45.4 kg (100 lb)   LMP 06/06/2009   SpO2 100%   BMI 18.00 kg/m   Physical Exam  Constitutional: She is oriented to person, place, and time. She appears well-developed and well-nourished. No distress.  HENT:  Head: Normocephalic and atraumatic.  Nose: Nose normal.  Mouth/Throat: Uvula is midline, oropharynx is clear and moist and mucous membranes are normal. Mucous membranes are not pale and not dry.  Eyes: EOM are normal.  Neck: Normal range of motion.  Cardiovascular: Normal rate, regular rhythm and intact distal pulses.   Pulmonary/Chest: Effort normal and breath sounds normal. No respiratory distress. She has no wheezes. She exhibits no tenderness.  Abdominal: Soft. Bowel sounds are normal. She exhibits no distension and no mass. There is tenderness. There is no rigidity, no rebound and no guarding.  Tenderness of epigastric area. No tenderness elsewhere in the abdomen.  Abdomen is nondistended, and no rebound tenderness.  Musculoskeletal: Normal range of motion.  Neurological: She is alert and oriented to person, place, and time.  Skin: Skin is warm. No rash noted.  Psychiatric: She has a normal mood and affect.  Nursing note and vitals reviewed.    ED Treatments / Results  Labs (all labs ordered are listed, but only  abnormal results are displayed) Labs Reviewed  COMPREHENSIVE METABOLIC PANEL - Abnormal; Notable for the following:       Result Value   BUN 5 (*)    All other components within normal limits  CBC  LIPASE, BLOOD    EKG  EKG Interpretation None       Radiology Dg Abdomen 1 View  Result Date: 03/26/2017 CLINICAL DATA:  Acute abdominal pain following endoscopy yesterday. EXAM: ABDOMEN - 1 VIEW COMPARISON:  03/02/2017 CT FINDINGS: Unremarkable bowel gas pattern with gas and stool in the colon and small amount of gas and small bowel loops. No dilated bowel loops are present. Cholecystectomy clips are present. IMPRESSION: No acute abnormality. Electronically Signed   By: Harmon PierJeffrey  Hu M.D.   On: 03/26/2017 14:09    Procedures Procedures (including critical care time)  Medications Ordered in ED Medications  sodium chloride 0.9 % bolus 1,000 mL (0 mLs Intravenous Stopped 03/26/17 1450)     Initial Impression / Assessment and Plan / ED Course  I have reviewed the triage vital signs and the nursing notes.  Pertinent labs & imaging results that were available during my care of the patient were reviewed by me and considered in my medical decision making (see chart for details).     Pt with shakiness, worsening pain and nausea s/p endoscopy and colonoscopy. Physical exam showed tenderness only at the epigastrium. No peritoneal signs. No rebound. No pain elsewhere in the abd. Pt states she would just like to be evaluated for dehydration, and then go home and recover. Discussed possibility of perforation of the abd. Pt agrees to abd xray,  but does not want CT, Will order orthostatic vitals and basic labs to check anemia, electrolytes, and WBC. Start IVF. She does not want any medications.  CMP and CBC reassuring. Orthostatic vitals negative. abd xray does not show free air. discussed findings with pt. At this time, doubt infection, perforation, or obstruction. Return precautions given. Pt states she understands and agrees to plan.    Final Clinical Impressions(s) / ED Diagnoses   Final diagnoses:  Epigastric pain    New Prescriptions Discharge Medication List as of 03/26/2017  2:34 PM       Alveria ApleyCaccavale, Alexes Menchaca, PA-C 03/26/17 2138    Pricilla LovelessGoldston, Scott, MD 04/04/17 [id number]

## 2017-03-26 NOTE — Discharge Instructions (Signed)
Follow-up instructions see her GI doctor gave you. Try to stay well hydrated with water. Take Zofran as needed for nausea or vomiting. Follow-up with your primary care doctor if your pain is persisting. Return to the emergency department if you develop fever, chills, vomiting, worsening pain, or any new or worsening symptoms.

## 2017-04-01 ENCOUNTER — Other Ambulatory Visit: Payer: Self-pay | Admitting: Obstetrics and Gynecology

## 2017-04-01 DIAGNOSIS — N644 Mastodynia: Secondary | ICD-10-CM

## 2017-04-06 ENCOUNTER — Other Ambulatory Visit: Payer: Managed Care, Other (non HMO)

## 2017-05-01 ENCOUNTER — Inpatient Hospital Stay
Admission: RE | Admit: 2017-05-01 | Discharge: 2017-05-01 | Disposition: A | Discharge status: Home / Self Care | Payer: Managed Care, Other (non HMO) | Source: Ambulatory Visit | Attending: Obstetrics and Gynecology | Admitting: Obstetrics and Gynecology

## 2017-05-01 ENCOUNTER — Other Ambulatory Visit: Payer: Managed Care, Other (non HMO)

## 2017-05-29 ENCOUNTER — Encounter: Payer: Self-pay | Admitting: *Deleted

## 2017-06-15 ENCOUNTER — Ambulatory Visit (INDEPENDENT_AMBULATORY_CARE_PROVIDER_SITE_OTHER): Payer: Managed Care, Other (non HMO) | Admitting: Cardiology

## 2017-06-15 ENCOUNTER — Encounter: Payer: Self-pay | Admitting: Cardiology

## 2017-06-15 VITALS — BP 132/86 | HR 118 | Resp 16 | Ht 62.5 in | Wt 102.0 lb

## 2017-06-15 DIAGNOSIS — R42 Dizziness and giddiness: Secondary | ICD-10-CM

## 2017-06-15 DIAGNOSIS — R55 Syncope and collapse: Secondary | ICD-10-CM | POA: Diagnosis not present

## 2017-06-15 DIAGNOSIS — R002 Palpitations: Secondary | ICD-10-CM

## 2017-06-15 NOTE — Patient Instructions (Signed)
Medication Instructions:   Your physician recommends that you continue on your current medications as directed. Please refer to the Current Medication list given to you today.   If you need a refill on your cardiac medications before your next appointment, please call your pharmacy.  Labwork: NONE ORDERED  TODAY    Testing/Procedures: Your physician has requested that you have an echocardiogram. Echocardiography is a painless test that uses sound waves to create images of your heart. It provides your doctor with information about the size and shape of your heart and how well your heart's chambers and valves are working. This procedure takes approximately one hour. There are no restrictions for this procedure.     Follow-Up:    AS SCHEDULED    Any Other Special Instructions Will Be Listed Below (If Applicable).

## 2017-06-15 NOTE — Progress Notes (Signed)
06/15/2017 RODNEY WIGGER   12/14/1960  [id number]  Primary Physician Juluis Rainier, MD Primary Cardiologist: Dr. Delton See   Reason for Visit/CC: Palpitations and Syncope  HPI:  Katie Brewer is a 56 y.o. female with a h/o palpitations and hypothyroidism. She was previously seen by Dr. Donnie Aho, who ordered an outpatient monitor which showed inappropriate sinus tachycardia. She was placed on metoprolol but did not tolerate it well. She had side affects with fatigue and little improvement with palpitations. She is now followed by Dr. Delton See. In 2014, she had a echocardiogram with normal systolic function LVEF 60%, normal biatrial size, normal right ventricular size and function, mild mitral valve prolapse with mild regurgitation. Mild tricuspid regurgitation. She has no known CAD. Most recently, Dr. Delton See ordered an ETT June 2018 which showed no ischemia and no arrhythmias during exercise or afterwards. Normal BP response. A 48 holter monitor was also ordered in June given recurrent palpitations. This showed SR to sinus tachycardia and rare PACs. Dr. Delton See ordered for her to retry metoprolol, at a low dose of 12.5 mg BID. She also advised that she limit caffieien, stay well hydrated and exercise regularly (walking).   Pt reports that she tried taking metoprolol but did not tolerate due to significant drops in blood pressure.  She has discontinued this.  She presents back to clinic today with her son who is a Education officer, environmental at [employer] in Arts development officer.  She is tearful and very frustrated giving her current physical being.  She has been dealing with thyroid problems for many years.  She has hypothyroidism and has required hormone replacement therapy for several years.  She has been followed by an endocrinologist and for many years was on Armour. However she felt poorly with this. She has recently changed her regimen. Her endocrinologist stopped her Armour for 5-6  weeks. She felt significantly better. She noted "I felt human again". She could function and was able to get back to a normal lifestyle. She had more energy. He had no further palpitations. No tachycardia. She had f/u labs which showed very high TSH, thus her endocrinologist recommended that she go back on thyroid replacement. He started her on low dose synthyroid, initially 25 mcg daily.  He then further increased to 50 mcg daily.  She did not tolerate this.  She started to redevelop the same symptoms she had previously.  Synthroid was then reduced back down to 25 mcg.  Despite this she continued to feel poorly with tachypalpitations, dizziness and also reports an episode of syncope the other day. This occurred while sitting.  She denies any chest pain.  No dyspnea.  Again she is very tearful given her inability to function normally.  EKG today shows sinus tachycardia 102 bpm.   She describes her recent syncopal episode that she had the other day as feeling hot and clammy before she passed out, and her vision started to get blurry/ tunnled.  She had tachypalpitations.  Her son reports that her face turned grey before this happend.  She has had several episodes of near syncope since she passed out initially.  Current Meds  Medication Sig  . Cholecalciferol (VITAMIN D3) 1000 UNITS tablet Take 1,000 Units by mouth daily.    . Coconut Oil OIL Place rectally. As directed   . Digestive Enzymes (DIGESTIVE ENZYME PO) Take 1 tablet by mouth every morning. 4 mornings per week  . estazolam (PROSOM) 2 MG tablet Take 1 tablet by mouth  at bedtime as needed.  . [DISCONTINUED] ARMOUR THYROID 60 MG tablet Take as directed   . [DISCONTINUED] GLYCINE PO Take 1 tablet by mouth every morning. 4 morning per week   Allergies  Allergen Reactions  . Meperidine      Respiratory distress  . Shellfish Allergy Nausea And Vomiting  . Ampicillin Rash  . Demerol   . Erythromycin     Elevated liver enzymes  . Influenza  Vaccines   . Penicillins   . Sulfa Antibiotics     Elevated liver enzymes  . Sulfa Drugs Cross Reactors   . Thimerosal    Past Medical History:  Diagnosis Date  . Diverticulosis   . Hemorrhoids   . History of chicken pox   . History of cystocele   . Hyperlipidemia   . Hypertension   . Hypothyroidism   . Iron deficiency anemia   . MVP (mitral valve prolapse)   . Rectocele   . SVT (supraventricular tachycardia) (HCC)   . Tachycardia   . Uterine prolapse    Family History  Problem Relation Age of Onset  . Diabetes type I Son   . Diabetes type II Father        and mother   . Heart disease Father   . High blood pressure Father   . Skin cancer Father        non-melanoma  . Ovarian cancer Mother 22  . Skin cancer Mother        non-melanoma  . Renal cancer Sister        died of neuroblastoma at age 87-11y; diagnosed 1 year before  . Breast cancer Maternal Aunt 14  . Lung cancer Maternal Uncle        not a smoker; worked around tobacco  . Stomach cancer Paternal Uncle 80  . Testicular cancer Paternal Uncle        dx. 30s  . Heart attack Maternal Grandfather        d. 20s  . Kidney Stones Paternal Grandmother        had to have a kidney removed  . Stroke Paternal Grandfather        d. 18  . Heart attack Paternal Grandfather        smoker  . Other Daughter        gluten sensitivity  . Heart disease Maternal Uncle        d. 68s  . Breast cancer Cousin        maternal 1st cousin dx breast cancer at 22-62; w/ mets  . Colon cancer Neg Hx    Past Surgical History:  Procedure Laterality Date  . APPENDECTOMY  1976  . BREAST BIOPSY Left 1996  . CHOLECYSTECTOMY  2012  . HYMENOTOMY / HYMENECTOMY    . TONSILLECTOMY  1981   Social History   Social History  . Marital status: Married    Spouse name: N/A  . Number of children: 2  . Years of education: N/A   Occupational History  . Homemaker/Teacher    Social History Main Topics  . Smoking status: Never Smoker  .  Smokeless tobacco: Never Used  . Alcohol use No  . Drug use: No  . Sexual activity: Not on file   Other Topics Concern  . Not on file   Social History Narrative   Caffeine daily--tea      Review of Systems: General: negative for chills, fever, night sweats or weight changes.  Cardiovascular: negative for chest  pain, dyspnea on exertion, edema, orthopnea, palpitations, paroxysmal nocturnal dyspnea or shortness of breath Dermatological: negative for rash Respiratory: negative for cough or wheezing Urologic: negative for hematuria Abdominal: negative for nausea, vomiting, diarrhea, bright red blood per rectum, melena, or hematemesis Neurologic: negative for visual changes, syncope, or dizziness All other systems reviewed and are otherwise negative except as noted above.   Physical Exam:  Blood pressure 132/86, pulse (!) 118, resp. rate 16, height 5' 2.5" (1.588 m), weight 102 lb (46.3 kg), last menstrual period 06/06/2009, SpO2 96 %.  General appearance: alert, cooperative and no distress Neck: no carotid bruit and no JVD Lungs: clear to auscultation bilaterally Heart: regular rate and rhythm, S1, S2 normal, no murmur, click, rub or gallop Extremities: extremities normal, atraumatic, no cyanosis or edema Pulses: 2+ and symmetric Skin: Skin color, texture, turgor normal. No rashes or lesions Neurologic: Grossly normal  EKG sinus tach -- personally reviewed   ASSESSMENT AND PLAN:   1. Tachycardia/ palpitations: EKG shows HR in the low 100s. SR. Intolerant to BBs given frequent hypotension. I think a lot of this is secondary to her underlying thyroid issues. She has a long history of hypothyroidism and difficulties tolerating hormone replacement as well as issues with sinus tachycardia since 2014. It is affecting her ability to function. She has also had worsening symptoms with positional changes and also recent syncope/ near syncope? Neurocardiogenic syncope. Recent ETT was negative  for ischemia. Her last 2D echo was in 2014 and pt notes h/o MVP, although I do no appreciate any murmurs or clicks on exam today. We will schedule for repeat 2D Echo and will place referral to EP for her to see Dr. Graciela HusbandsKlein to see if there are any additional cardiac therapies or w/u to pursue. She will need to f/u with her endocrinologist regarding her thyroid and treatment.    Brittainy Delmer IslamSimmons PA-C, [facility] 06/15/2017 5:19 PM

## 2017-06-16 ENCOUNTER — Other Ambulatory Visit: Payer: Self-pay

## 2017-06-16 ENCOUNTER — Telehealth: Payer: Self-pay

## 2017-06-16 DIAGNOSIS — E039 Hypothyroidism, unspecified: Secondary | ICD-10-CM

## 2017-06-16 DIAGNOSIS — R002 Palpitations: Secondary | ICD-10-CM

## 2017-06-16 NOTE — Telephone Encounter (Addendum)
06/16/2017 10:55 Pt aware of need for lab work at next appointment.  Orders entered and labs scheduled. Jim Likeeri Suits [facility] RN CCM    ----- Message from Allayne ButcherBrittainy M Simmons, PA-C sent at 06/15/2017  5:44 PM EDT ----- Regarding: needs labwork Records from [facility] were reviewed. Her last CBC and TSH was in March. Not checked recently. The only labs checked on 10/2 was a BMP.   Given her recent symptoms, I think we should check a CBC, TSH, Free T3/T4, CMP and Mg level. Pt can get labs drawn when she comes back for monitor and echo. Does not need to fast. Please call pt to notify and place orders for labs please.

## 2017-06-23 ENCOUNTER — Encounter: Payer: Self-pay | Admitting: Neurology

## 2017-06-23 ENCOUNTER — Ambulatory Visit (INDEPENDENT_AMBULATORY_CARE_PROVIDER_SITE_OTHER): Payer: Managed Care, Other (non HMO) | Admitting: Neurology

## 2017-06-23 ENCOUNTER — Encounter: Payer: Self-pay | Admitting: Cardiology

## 2017-06-23 VITALS — Ht 62.5 in | Wt 102.2 lb

## 2017-06-23 DIAGNOSIS — E538 Deficiency of other specified B group vitamins: Secondary | ICD-10-CM

## 2017-06-23 DIAGNOSIS — H53133 Sudden visual loss, bilateral: Secondary | ICD-10-CM

## 2017-06-23 DIAGNOSIS — H905 Unspecified sensorineural hearing loss: Secondary | ICD-10-CM

## 2017-06-23 DIAGNOSIS — E519 Thiamine deficiency, unspecified: Secondary | ICD-10-CM

## 2017-06-23 DIAGNOSIS — R55 Syncope and collapse: Secondary | ICD-10-CM

## 2017-06-23 DIAGNOSIS — H9190 Unspecified hearing loss, unspecified ear: Secondary | ICD-10-CM

## 2017-06-23 DIAGNOSIS — R42 Dizziness and giddiness: Secondary | ICD-10-CM

## 2017-06-23 NOTE — Patient Instructions (Signed)
Vasovagal Syncope, Adult  Syncope, which is commonly known as fainting or passing out, is a temporary loss of consciousness. It occurs when the blood flow to the brain is reduced. Vasovagal syncope, also called neurocardiogenic syncope, is a fainting spell that happens when blood flow to the brain is reduced because of a sudden drop in heart rate and blood pressure.  Vasovagal syncope is usually harmless. However, you can get injured if you fall during a fainting spell.  What are the causes?  This condition is caused by a drop in heart rate and blood pressure, usually in response to a trigger. Many things and situations can trigger an episode, including:  · Pain.  · Fear.  · The sight of blood. This may occur during medical procedures, such as when blood is being drawn from a vein.  · Common activities, such as coughing, swallowing, stretching, or going to the bathroom.  · Emotional stress.  · Being in a confined space.  · Prolonged standing, especially in a warm environment.  · Lack of sleep or rest.  · Not eating for a long time.  · Not drinking enough liquids.  · Recent illness.  · Drinking alcohol.  · Taking drugs that affect blood pressure, such as marijuana, cocaine, opiates, or inhalants.    What are the signs or symptoms?  Before a fainting episode, you may:  · Feel dizzy or light-headed.  · Become pale.  · Sense that you are going to faint.  · Feel like the room is spinning.  · Only see directly ahead (tunnel vision).  · Feel sick to your stomach (nauseous).  · See spots.  · Slowly lose vision.  · Hear ringing in your ears.  · Have a headache.  · Feel warm and sweaty.  · Feel a sensation of pins and needles.    During the fainting spell, you may twitch or make jerky movements. Fainting spells usually last no longer than a few minutes before you wake up. If you get up too quickly before your body can recover, you may faint again.  How is this diagnosed?  This condition is diagnosed based on your symptoms,  your medical history, and a physical exam. Tests may be done to rule out other causes of fainting. Tests may include:  · Blood tests.  · Heart tests, such as an electrocardiogram (ECG), echocardiogram, or electrophysiology study.  · A test to check your response to changes in position (tilt table test).    How is this treated?  Usually, treatment is not needed for this condition. Your health care provider may suggest ways to help prevent fainting episodes. These may include:  · Drinking additional fluids if you are exposed to a trigger.  · Sitting or lying down if you notice signs that an episode is coming.    If your fainting spells continue, your health care provider may recommend that you:  · Take medicines to prevent fainting or to help reduce further episodes of fainting.  · Do certain exercises.  · Wear compression stockings.  · Have surgery to place a pacemaker in your body (rare).    Follow these instructions at home:  · Learn to identify the signs that an episode is coming.  · Sit or lie down at the first sign of a fainting spell. If you sit down, put your head down between your legs. If you lie down, swing your legs up in the air to increase blood flow to the brain.  ·   Avoid hot tubs and saunas.  · Avoid standing for a long time. If you have to stand for a long time, try:  ? Crossing your legs.  ? Flexing and stretching your leg muscles.  ? Squatting.  ? Moving your legs.  ? Bending over.  · Drink enough fluid to keep your urine clear or pale yellow.  · Make changes to your diet that your health care provider recommends. You may be told to:  ? Avoid caffeine.  ? Eat more salt.  · Take over-the-counter and prescription medicines only as told by your health care provider.  Contact a health care provider if:  · You continue to have fainting spells despite treatment.  · You faint more often despite treatment.  · You lose consciousness for more than a few minutes.  · You faint during or after exercising or  after being startled.  · You have twitching or jerky movements for longer than a few seconds during a fainting spell.  · You have an episode of twitching or jerky movements without fainting.  Get help right away if:  · A fainting spell leads to an injury or bleeding.  · You have new symptoms that occur with the fainting spells, such as:  ? Shortness of breath.  ? Chest pain.  ? Irregular heartbeat.  · You twitch or make jerky movements for more than 5 minutes.  · You twitch or make jerky movements during more than one fainting spell.  This information is not intended to replace advice given to you by your health care provider. Make sure you discuss any questions you have with your health care provider.  Document Released: 07/21/2012 Document Revised: 01/16/2016 Document Reviewed: 06/02/2015  Elsevier Interactive Patient Education © 2018 Elsevier Inc[employer]

## 2017-06-23 NOTE — Progress Notes (Addendum)
[facility]    Provider:  Dr Lucia Gaskins Referring Provider: Juluis Rainier, MD Primary Care Physician:  Juluis Rainier, MD  CC:  Syncope and Collapse  HPI:  Katie Brewer is a 56 y.o. female here as a referral from Dr. Zachery Dauer for Syncope and Collapse.  Past medical history of tachycardia, supraventricular tachycardia, mitral valve prolapse, hypothyroidism, hypertension, hyperlipidemia, Hashimoto's thyroiditis.  She has what appears to be significant situational anxiety, and has described her emotions as "being out of control" in the past. Last year about this time she was walking >10,000 steps a day, in March she would get head "blips", they feel like her stomach drops like on a nelevator, she feels it in her head, it may happen multiple times, her husband said she "went grey", like her brain is going to shut down and she feels a surge to stay focused, she has to lay down, she had a monitor and had an episode, 95% of the time it happened after walking 30-45 minutes, she hydrates, she eats, then she started having digestive issues and has lost weight, not on thyroid medication.  She saw the endocrinologist, she stopped the thyroid medication and after 6 weeks she felt better, she was eating, she felt like she had her life back. She felt great. She felt a "flip flop" , she felt her vision getting smaller, hot, palpitations, greying out. She was told it was vasovagal. She started synthroid, symptoms worse. She has pulsatile tinnitus. She has had episodes of alteration of awareness in the past with confusion. She has loss of vision. Denies any current tremor.  [name] with her friend who also provides information.  Reviewed notes, labs and imaging from outside physicians, which showed:  Reviewed primary care notes.  CBC unremarkable, CMP June 2018 showed slightly elevated glucose, creatinine 0.59, BUN 11, otherwise normal.  Having presyncopal episodes.  She has  tachycardia, heart rates up to 118, she has had a 20-48-hour Holter monitor which was reportedly normal, she has an echocardiogram and an electrophysiology consult with Dr. Graciela Husbands schedule soon, she had a near syncopal episode while driving in September, she is noted her heart flip flopping, 2 episodes on Saturday where she "great out" but did not actually pass out.  She has described her emotions as "being out of control", she has been crying more recently. Review of Systems: Patient complains of symptoms per HPI as well as the following symptoms: Weight loss, blurred vision, palpitations, feeling,, feeling cold, flushing, constipation, depression, anxiety. Pertinent negatives and positives per HPI. All others negative.   Social History   Socioeconomic History  . Marital status: Married    Spouse name: Not on file  . Number of children: 2  . Years of education: Not on file  . Highest education level: Not on file  Social Needs  . Financial resource strain: Not on file  . Food insecurity - worry: Not on file  . Food insecurity - inability: Not on file  . Transportation needs - medical: Not on file  . Transportation needs - non-medical: Not on file  Occupational History  . Occupation: Homemaker/Teacher  Tobacco Use  . Smoking status: Never Smoker  . Smokeless tobacco: Never Used  Substance and Sexual Activity  . Alcohol use: No  . Drug use: No  . Sexual activity: Not on file  Other Topics Concern  . Not on file  Social History Narrative   Caffeine daily--tea     Family History  Problem  Relation Age of Onset  . Diabetes type I Son   . Diabetes type II Father        and mother   . Heart disease Father   . High blood pressure Father   . Skin cancer Father        non-melanoma  . Ovarian cancer Mother [id number]  . Skin cancer Mother        non-melanoma  . Renal cancer Sister        died of neuroblastoma at age 58-11y; diagnosed 1 year before  . Breast cancer Maternal Aunt [id number]  . Lung  cancer Maternal Uncle        not a smoker; worked around tobacco  . Stomach cancer Paternal Uncle [id number]  . Testicular cancer Paternal Uncle        dx. 30s  . Heart attack Maternal Grandfather        d. 7470s  . Kidney Stones Paternal Grandmother        had to have a kidney removed  . Stroke Paternal Grandfather        d. 9054  . Heart attack Paternal Grandfather        smoker  . Other Daughter        gluten sensitivity  . Heart disease Maternal Uncle        d. 7670s  . Breast cancer Cousin        maternal 1st cousin dx breast cancer at 8061-62; w/ mets  . Colon cancer Neg Hx     Past Medical History:  Diagnosis Date  . Diverticulosis   . Hashimoto's thyroiditis   . Hemorrhoids   . History of chicken pox   . History of cystocele   . Hyperlipidemia   . Hypertension   . Hypothyroidism   . MVP (mitral valve prolapse)   . Rectocele   . SVT (supraventricular tachycardia) (HCC)   . Tachycardia   . Uterine prolapse     Past Surgical History:  Procedure Laterality Date  . APPENDECTOMY  1976  . BREAST BIOPSY Left 1996  . CHOLECYSTECTOMY  2012  . TONSILLECTOMY  1981    Current Outpatient Medications  Medication Sig Dispense Refill  . Cholecalciferol (VITAMIN D3) 1000 UNITS tablet Take 1,000 Units by mouth daily.      . Coconut Oil OIL Place rectally. As directed     . Digestive Enzymes (DIGESTIVE ENZYME PO) Take 1 tablet by mouth every morning. 4 mornings per week    . estazolam (PROSOM) 2 MG tablet Take 1 tablet by mouth at bedtime as needed.     No current facility-administered medications for this visit.     Allergies as of 06/23/2017 - Review Complete 06/23/2017  Allergen Reaction Noted  . Meperidine  05/29/2015  . Shellfish allergy Nausea And Vomiting 07/28/2015  . Ampicillin Rash 01/24/2014  . Demerol  02/05/2011  . Erythromycin  02/05/2011  . Influenza vaccines  07/03/2016  . Penicillins  02/05/2011  . Sulfa antibiotics  01/24/2014  . Sulfa drugs cross reactors   02/05/2011  . Thimerosal  06/06/2013    Vitals: Ht 5' 2.5" (1.588 m)   Wt 102 lb 3.2 oz (46.4 kg)   LMP 06/06/2009   BMI 18.39 kg/m  Last Weight:  Wt Readings from Last 1 Encounters:  06/23/17 102 lb 3.2 oz (46.4 kg)   Last Height:   Ht Readings from Last 1 Encounters:  06/23/17 5' 2.5" (1.588 m)   Physical exam: Exam: Gen: Anxious ,  conversant, well nourised, thin , well groomed                     CV: RRR, no MRG. No Carotid Bruits. No peripheral edema, warm, nontender Eyes: Conjunctivae clear without exudates or hemorrhage  Neuro: Detailed Neurologic Exam  Speech:    Speech is normal; fluent and spontaneous with normal comprehension.  Cognition:    The patient is oriented to person, place, and time;     recent and remote memory intact;     language fluent;     normal attention, concentration,     fund of knowledge Cranial Nerves:    The pupils are equal, round, and reactive to light. The fundi are normal and spontaneous venous pulsations are present. Visual fields are full to finger confrontation. Extraocular movements are intact. Trigeminal sensation is intact and the muscles of mastication are normal. The face is symmetric. The palate elevates in the midline. Hearing intact. Voice is normal. Shoulder shrug is normal. The tongue has normal motion without fasciculations.   Coordination:    Normal finger to nose and heel to shin. Normal rapid alternating movements.   Gait:    Heel-toe and tandem gait are normal.   Motor Observation:    No asymmetry, no atrophy, and no involuntary movements noted. Tone:    Normal muscle tone.    Posture:    Posture is normal. normal erect    Strength:    Strength is V/V in the upper and lower limbs.      Sensation: intact to LT     Reflex Exam:  DTR's:    Deep tendon reflexes in the upper and lower extremities are normal bilaterally.   Toes:    The toes are downgoing bilaterally.   Clonus:    Clonus is absent.         Assessment/Plan:  Patient with pre-syncope, dizziness, vision loss, unclear strange feelings in her head, pulsatile tinnitus, alteration of awareness.   Needs MRI of the brain to evaluate for strokes, MRA of the head Seeing Dr. Graciela HusbandsKlein in Cardiology, discuss carotid dopplers with him Will check vitamin b1,b12,mma due to weight loss and possibly nutrional deficiencies as these can cause neuropsychiatric issues  Orders Placed This Encounter  Procedures  . MR BRAIN W WO CONTRAST  . B12 and Folate Panel  . Methylmalonic acid, serum  . Vitamin B1  . Basic Metabolic Panel   16/1011/20: mag 2.3, thiamine 100, mma 86, b12 821, folate 16  tsh 26 but nml t3r, [id number]  Naomie DeanAntonia Bert Givans, MD  [facility] [address] [place], [place] [id number]  Phone [phone] Fax [phone]

## 2017-06-25 ENCOUNTER — Encounter: Payer: Self-pay | Admitting: Cardiology

## 2017-06-25 ENCOUNTER — Encounter: Payer: Self-pay | Admitting: Neurology

## 2017-07-06 ENCOUNTER — Other Ambulatory Visit: Payer: Self-pay

## 2017-07-06 ENCOUNTER — Ambulatory Visit (INDEPENDENT_AMBULATORY_CARE_PROVIDER_SITE_OTHER): Payer: Managed Care, Other (non HMO) | Admitting: Internal Medicine

## 2017-07-06 ENCOUNTER — Ambulatory Visit (HOSPITAL_COMMUNITY): Payer: Managed Care, Other (non HMO) | Attending: Cardiology

## 2017-07-06 ENCOUNTER — Encounter: Payer: Self-pay | Admitting: Internal Medicine

## 2017-07-06 ENCOUNTER — Other Ambulatory Visit: Payer: Managed Care, Other (non HMO)

## 2017-07-06 VITALS — BP 142/60 | HR 113 | Ht 62.0 in | Wt 102.0 lb

## 2017-07-06 DIAGNOSIS — R42 Dizziness and giddiness: Secondary | ICD-10-CM | POA: Diagnosis not present

## 2017-07-06 DIAGNOSIS — R002 Palpitations: Secondary | ICD-10-CM | POA: Diagnosis not present

## 2017-07-06 DIAGNOSIS — R000 Tachycardia, unspecified: Secondary | ICD-10-CM | POA: Diagnosis not present

## 2017-07-06 DIAGNOSIS — R55 Syncope and collapse: Secondary | ICD-10-CM | POA: Diagnosis not present

## 2017-07-06 NOTE — Progress Notes (Signed)
ELECTROPHYSIOLOGY CONSULT NOTE  Patient ID: Katie Brewer, MRN: [id number], DOB/AGE: 1960-11-26 56 y.o. Admit date: (Not on file) Date of Consult: 07/06/2017  Primary Physician: Juluis RainierBarnes, Emelynn, MD Primary Cardiologist: Verlin FesterKN     Katie Brewer is a 56 y.o. female who is being seen today for the evaluation of tachycardia at the request of Dr Verlin FesterKN .    HPI Katie Brewer is a 56 y.o. female  With a history of tachycardia in the context of history of Hashimoto's thyroiditis for which she had been on Armour Thyroid for many many years.  The tachycardia  originally came to evaluation in 2011.  Evaluation showed only mitral valve prolapse.  She had recurrent palpitations and nothing specific was identified. At some point she was put on metoprolol which aggravated her symptoms with lethargy.  She decided to try to "walk at all off"and she began with a fit bit walking up to 10,000 steps a day.  She was able to come off of her beta-blocker and felt considerably better.  April 2018 everything changed.  She started noting "a flicker in the head "when she finished walking.  It was not a visual sensation but a intracranial sensation of instability.  It never occurred while walking but occurred afterwards.  Her son who is a Environmental managerphysics and strongly PhD student at [employer] measured it with a watch and found it to be occurring on a relatively reproducible cycle of 2-3 seconds.  Ensuing months have been horrible.  She has been unable to eat.  She has lost about 10 or 15 pounds.  She is anorectic.  With even minimal eating her heart rate goes up.  Stopped her thyroid replacement and things seem to get better.  Unfortunately her TSH went from 3--41 and she was started on 25 mcg of Synthroid.  Her symptoms were relatively quiescent at this point but then the dose increased to 25--50 with recurrent symptoms.  Unfortunately, on reducing the dose to 25 she was still stuck with overwhelming nausea emotional  lability and tachypalpitations.  She has intercurrently discontinued thyroid replacement.  She had one event of presyncope in the car. Her diet is replete of fluid with clear urine. Her sodium intake is modest.  She is been eating with difficulties and mostly gluten-free.  She has tried many dietary exclusions  There have been issues related to her thyroid prompting the discontinuation of Armour Thyroid.  With recurrence of an elevated TSH she was started on Synthroid.  She has tried metoprolol is not tolerated.  Holter personally reviewed Mean HR 93  Diary events>> sinus 97 (feeling better-- HR 93)     119     117    Feel    "Head blip"  sinus   She also has complaints of noticing white mucus strands in her urine and white pellet-like lesions in her stool She has undergone a GI evaluation and [place]  Past Medical History:  Diagnosis Date  . Diverticulosis   . Hashimoto's thyroiditis   . Hemorrhoids   . History of chicken pox   . History of cystocele   . Hyperlipidemia   . Hypertension   . Hypothyroidism   . MVP (mitral valve prolapse)   . Rectocele   . SVT (supraventricular tachycardia) (HCC)   . Tachycardia   . Uterine prolapse       Surgical History:  Past Surgical History:  Procedure Laterality Date  . APPENDECTOMY  1976  .  BREAST BIOPSY Left 1996  . CHOLECYSTECTOMY  2012  . TONSILLECTOMY  1981     Home Meds: Prior to Admission medications   Medication Sig Start Date End Date Taking? Authorizing Provider  Cholecalciferol (VITAMIN D3) 1000 UNITS tablet Take 1,000 Units by mouth daily.      [provider]  Coconut Oil OIL Place rectally. As directed     [provider]  Digestive Enzymes (DIGESTIVE ENZYME PO) Take 1 tablet by mouth every morning. 4 mornings per week    [provider]  estazolam (PROSOM) 2 MG tablet Take 1 tablet by mouth at bedtime as needed. 06/25/15   [provider]    Allergies:  Allergies    Allergen Reactions  . Meperidine      Respiratory distress  . Shellfish Allergy Nausea And Vomiting  . Ampicillin Rash  . Demerol   . Erythromycin     Elevated liver enzymes  . Influenza Vaccines   . Penicillins   . Sulfa Antibiotics     Elevated liver enzymes  . Sulfa Drugs Cross Reactors   . Thimerosal     Social History   Socioeconomic History  . Marital status: Married    Spouse name: Not on file  . Number of children: 2  . Years of education: Not on file  . Highest education level: Not on file  Social Needs  . Financial resource strain: Not on file  . Food insecurity - worry: Not on file  . Food insecurity - inability: Not on file  . Transportation needs - medical: Not on file  . Transportation needs - non-medical: Not on file  Occupational History  . Occupation: Homemaker/Teacher  Tobacco Use  . Smoking status: Never Smoker  . Smokeless tobacco: Never Used  Substance and Sexual Activity  . Alcohol use: No  . Drug use: No  . Sexual activity: Not on file  Other Topics Concern  . Not on file  Social History Narrative   Caffeine daily--tea      Family History  Problem Relation Age of Onset  . Diabetes type I Son   . Diabetes type II Father        and mother   . Heart disease Father   . High blood pressure Father   . Skin cancer Father        non-melanoma  . Ovarian cancer Mother [id number]  . Skin cancer Mother        non-melanoma  . Renal cancer Sister        died of neuroblastoma at age 58-11y; diagnosed 1 year before  . Breast cancer Maternal Aunt [id number]  . Lung cancer Maternal Uncle        not a smoker; worked around tobacco  . Stomach cancer Paternal Uncle [id number]  . Testicular cancer Paternal Uncle        dx. 30s  . Heart attack Maternal Grandfather        d. 5670s  . Kidney Stones Paternal Grandmother        had to have a kidney removed  . Stroke Paternal Grandfather        d. 3154  . Heart attack Paternal Grandfather        smoker  . Other Daughter         gluten sensitivity  . Heart disease Maternal Uncle        d. 5170s  . Breast cancer Cousin        maternal 1st  cousin dx breast cancer at 24-62; w/ mets  . Colon cancer Neg Hx      ROS:  Please see the history of present illness.     All other systems reviewed and negative.    Physical Exam: Blood pressure (!) 142/60, pulse (!) 113, height 5\' 2"  (1.575 m), weight 102 lb (46.3 kg), last menstrual period 06/06/2009, SpO2 98 %. General: Well developed, well nourished female in no acute distress. Head: Normocephalic, atraumatic, sclera non-icteric, no xanthomas, nares are without discharge. EENT: normal  Lymph Nodes:  none Neck: Negative for carotid bruits. JVD not elevated. Back:without scoliosis kyphosis  Lungs: Clear bilaterally to auscultation without wheezes, rales, or rhonchi. Breathing is unlabored. Heart: RRR with S1 S2. 2/6 murmur . No rubs, or gallops appreciated. Abdomen: Soft, non-tender, non-distended with normoactive bowel sounds. No hepatomegaly. No rebound/guarding. No obvious abdominal masses. Msk:  Strength and tone appear normal for age. Extremities: No clubbing or cyanosis. No  edema.  Distal pedal pulses are 2+ and equal bilaterally. Skin: Warm and Dry Neuro: Alert and oriented X 3. CN III-XII intact Grossly normal sensory and motor function . Psych:  Responds to questions appropriately with a normal affect.      Labs: Cardiac Enzymes No results for input(s): CKTOTAL, CKMB, TROPONINI in the last 72 hours. CBC Lab Results  Component Value Date   WBC 7.0 03/26/2017   HGB 14.6 03/26/2017   HCT 44.0 03/26/2017   MCV 87.5 03/26/2017   PLT 153 03/26/2017   PROTIME: No results for input(s): LABPROT, INR in the last 72 hours. Chemistry No results for input(s): NA, K, CL, CO2, BUN, CREATININE, CALCIUM, PROT, BILITOT, ALKPHOS, ALT, AST, GLUCOSE in the last 168 hours.  Invalid input(s): LABALBU Lipids No results found for: CHOL, HDL, LDLCALC, TRIG BNP No  results found for: PROBNP Thyroid Function Tests: No results for input(s): TSH, T4TOTAL, T3FREE, THYROIDAB in the last 72 hours.  Invalid input(s): FREET3 Miscellaneous Lab Results  Component Value Date   DDIMER <0.27 09/26/2012    Radiology/Studies:  No results found.  EKG: Sinus tachycardia at 113 Interval 15/06/32   Assessment and Plan:   Tachycardia  Hypothyroidism  Anorexia and nausea  Frustration  Anxiety  "Blips in head "  The blips in her head, while her son notes a frequency, have no associated arrhythmia--at their request I have given him the holter data to which he will attempt to apply his transformation programs looking for a frequency correlation   The patient has a long-standing history of thyroid disorders and tachycardia.  She has recently developed significant GI issues with weight loss anorexia and nausea.  It is not clear to me, not withstanding the objective evidence of modest orthostatic hypotension today, that the cardiac issues are primary.  Nor is it clear to me that there are primary autonomic issues.  I have offered her a beta-blocker trial; she would like to defer.  She hopes to get the thyroid issue clarified.  I have suggested also that a repeat GI consultation might be of value.  Furthermore I have suggested that a tertiary care Medical Center with its structure that facilitates intradepartmental communication might be to her advantage.  In this regard I have suggested [facility].  I spoke with her PCP reviewing the above   We will see her as needed.  We spent 85 minutes together   Sherryl Manges

## 2017-07-06 NOTE — Patient Instructions (Signed)
Your physician recommends that you continue on your current medications as directed. Please refer to the Current Medication list given to you today. Your physician recommends that you schedule a follow-up appointment as needed with Dr. Klein.   

## 2017-07-07 ENCOUNTER — Ambulatory Visit
Admission: RE | Admit: 2017-07-07 | Discharge: 2017-07-07 | Disposition: A | Discharge status: Home / Self Care | Payer: Managed Care, Other (non HMO) | Source: Ambulatory Visit | Attending: Neurology | Admitting: Neurology

## 2017-07-07 DIAGNOSIS — R55 Syncope and collapse: Secondary | ICD-10-CM | POA: Diagnosis not present

## 2017-07-07 DIAGNOSIS — H53133 Sudden visual loss, bilateral: Secondary | ICD-10-CM | POA: Diagnosis not present

## 2017-07-07 DIAGNOSIS — R42 Dizziness and giddiness: Secondary | ICD-10-CM | POA: Diagnosis not present

## 2017-07-07 DIAGNOSIS — H9190 Unspecified hearing loss, unspecified ear: Secondary | ICD-10-CM

## 2017-07-08 ENCOUNTER — Telehealth: Payer: Self-pay | Admitting: *Deleted

## 2017-07-08 NOTE — Telephone Encounter (Signed)
Called and LVM (ok per DPR) informing patient that her MRI of her brain is normal; the message does not require a call back but I left the office number and told patient to call if she had any questions.

## 2017-07-08 NOTE — Telephone Encounter (Signed)
-----   Message from Anson FretAntonia B Ahern, MD sent at 07/08/2017 11:09 AM EST ----- MRI brain normal thanks

## 2017-08-05 ENCOUNTER — Other Ambulatory Visit: Payer: Self-pay | Admitting: Obstetrics and Gynecology

## 2017-08-05 DIAGNOSIS — E041 Nontoxic single thyroid nodule: Secondary | ICD-10-CM

## 2017-08-07 ENCOUNTER — Ambulatory Visit
Admission: RE | Admit: 2017-08-07 | Discharge: 2017-08-07 | Disposition: A | Discharge status: Home / Self Care | Payer: Managed Care, Other (non HMO) | Source: Ambulatory Visit | Attending: Obstetrics and Gynecology | Admitting: Obstetrics and Gynecology

## 2017-08-07 DIAGNOSIS — E041 Nontoxic single thyroid nodule: Secondary | ICD-10-CM

## 2018-01-09 IMAGING — US US THYROID
2 series · 13 of 25 positions shown · non-contrast
Comparison: None.

CLINICAL DATA: Palpable abnormality.

EXAM:
THYROID ULTRASOUND
TECHNIQUE: Ultrasound examination of the thyroid gland and adjacent soft
tissues was performed.

[Series 1: us thyroid · 0.05mm/px · 12 of 50 slices shown (1 of 2)]
[im 1/50]
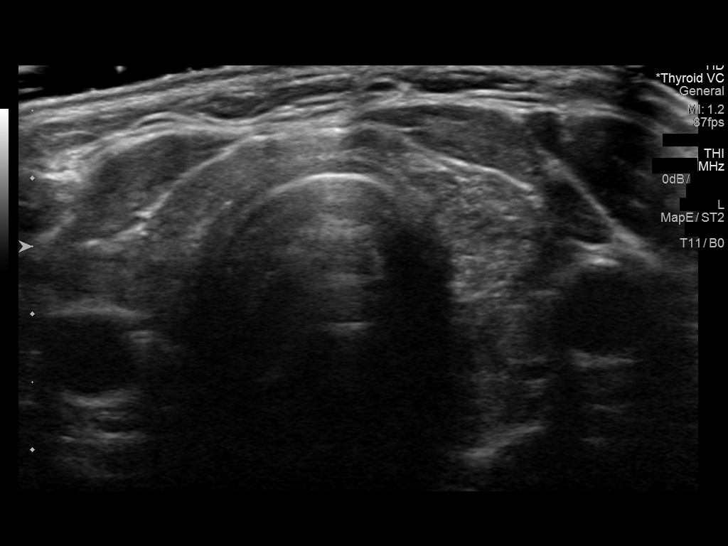
[im 5/50]
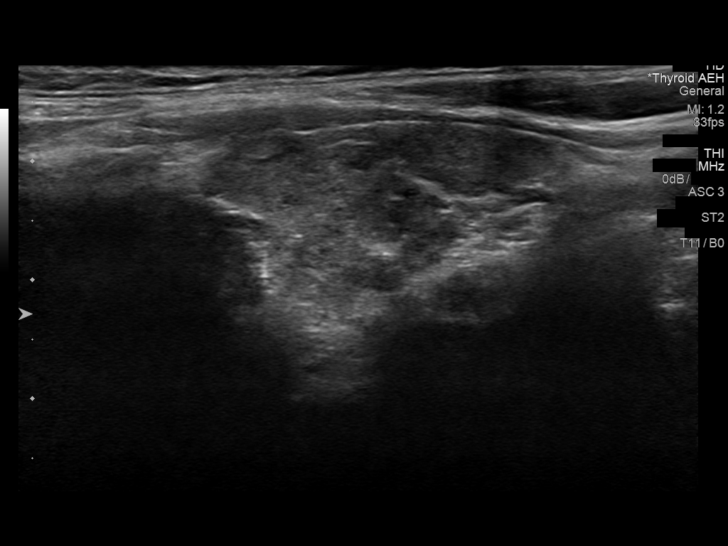
[im 9/50]
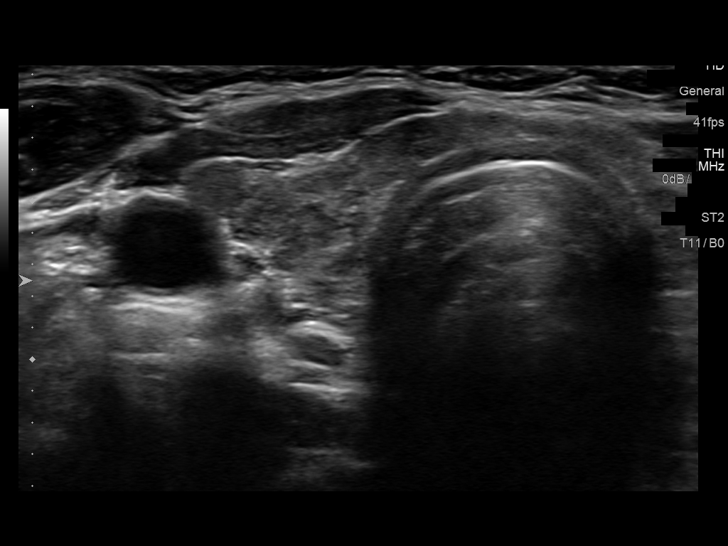
[im 13/50]
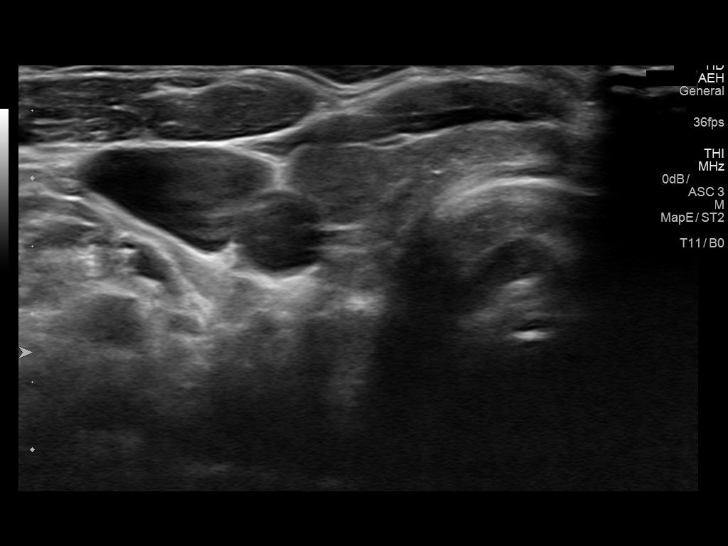
[im 18/50]
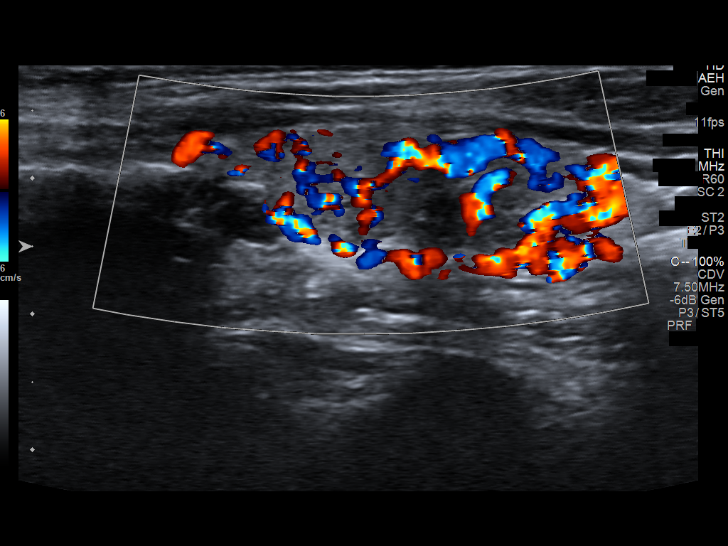
[im 22/50]
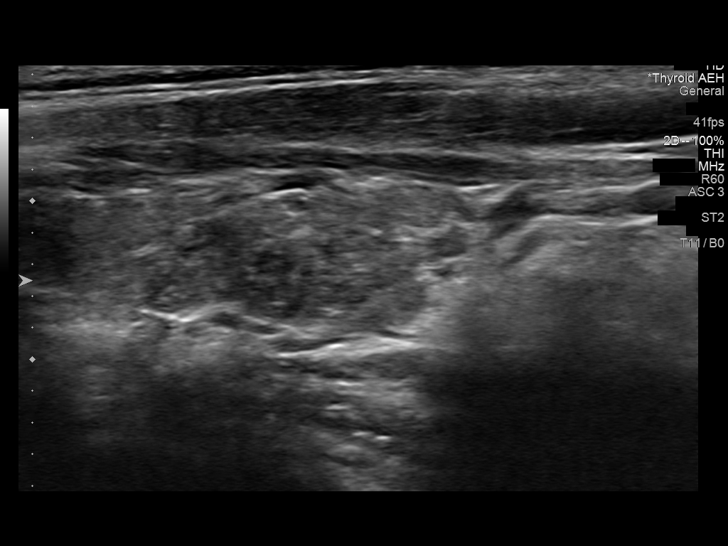
[im 26/50]
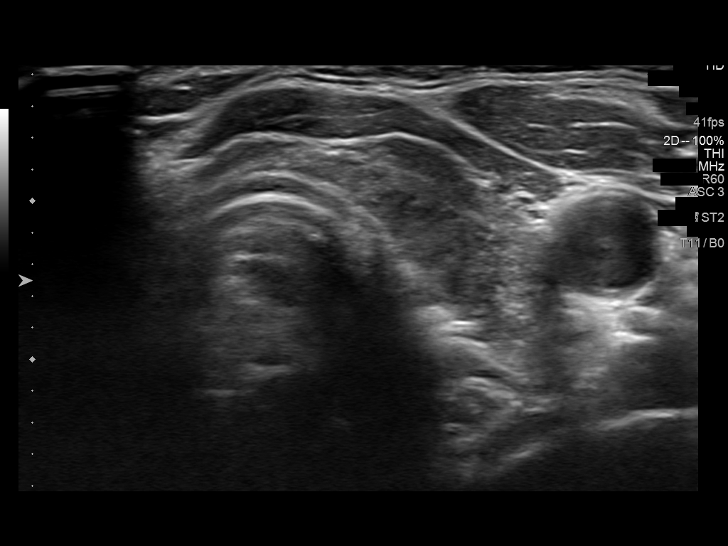
[im 30/50]
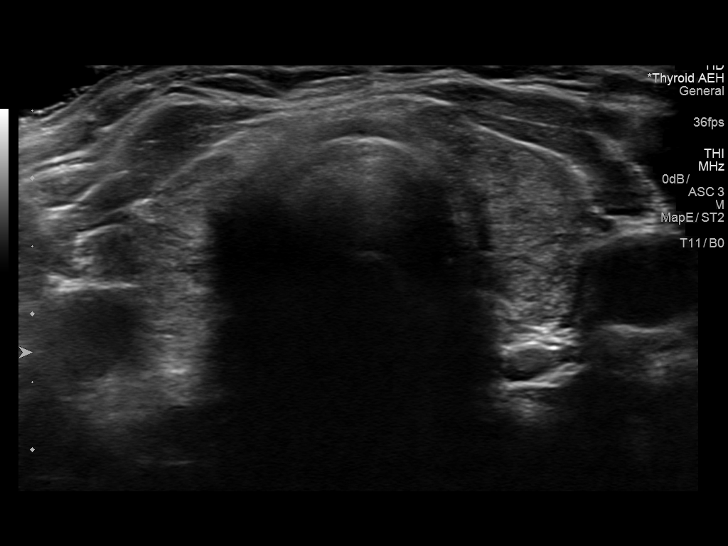
[im 35/50]
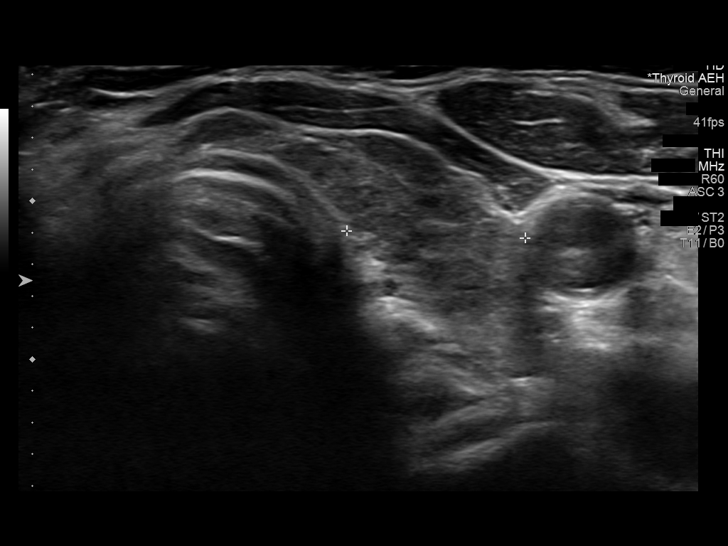
[im 39/50]
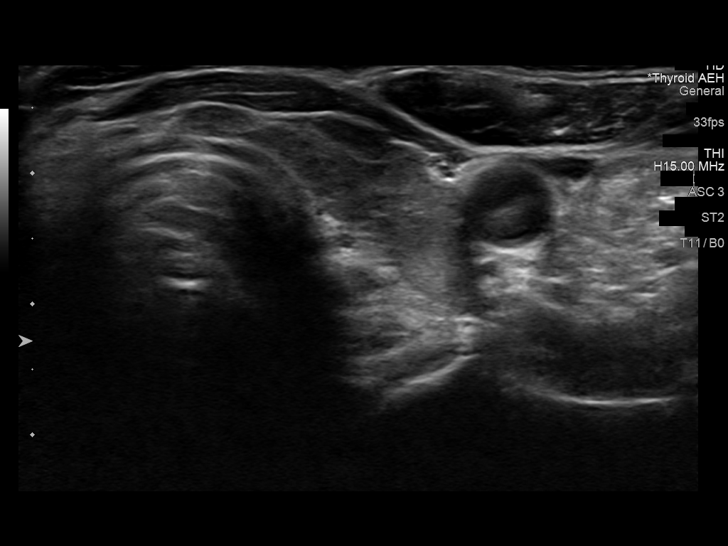
[im 43/50]
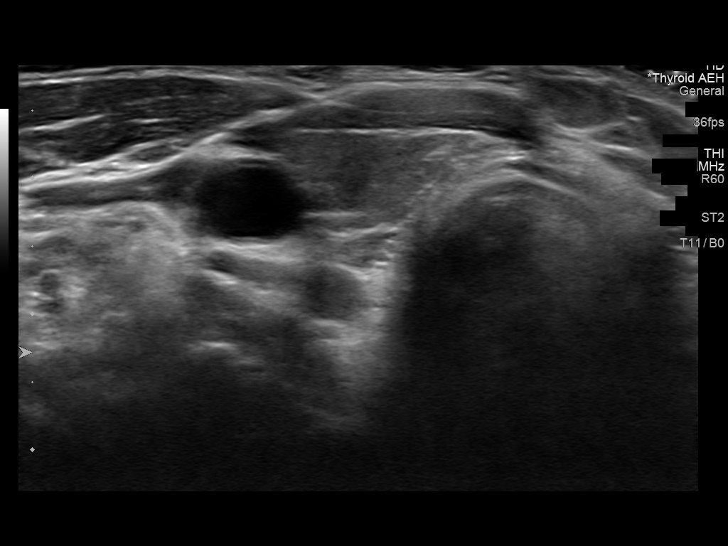
[im 47/50]
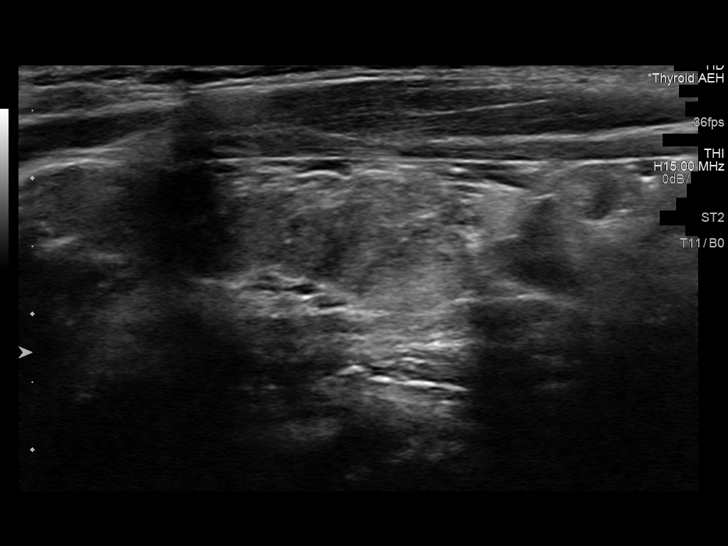

[Series 2001: us thyroid · 0.05mm/px · 1 of 3 slices shown (2 of 2)]
[im 1/3]
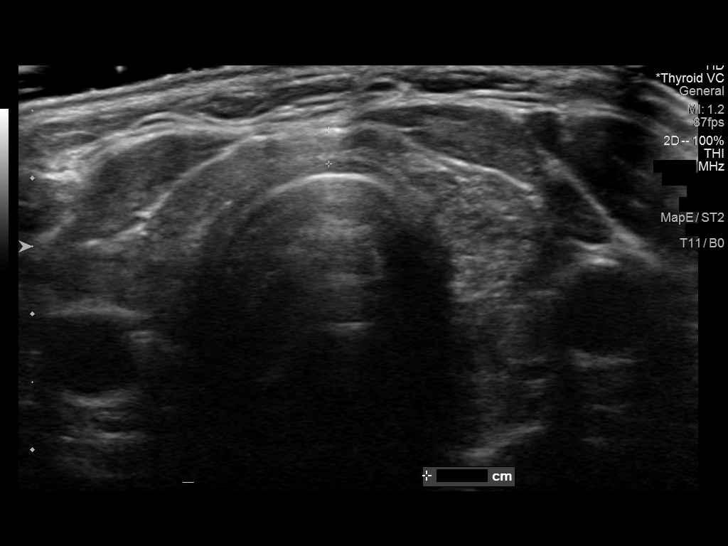

[13 of 25 positions shown; findings below may reference images not displayed]

FINDINGS: Parenchymal Echotexture: Moderately heterogenous

Isthmus: 0.3 cm

Right lobe: 3.3 x 1.6 x 1.3 cm

Left lobe: 3.7 x 1.0 x 1.3 cm

_________________________________________________________

Estimated total number of nodules >/= 1 cm: 0

Number of spongiform nodules >/=  2 cm not described below (TR1): 0

Number of mixed cystic and solid nodules >/= 1.5 cm not described
below (TR2): 0

_________________________________________________________

The thyroid gland is small and heterogeneous in echotexture without
discrete nodule identified. There is generalized subjectively
increased vascularity throughout both lobes. This is nonspecific but
can be associated with thyroiditis. Correlation suggested with TSH
and other thyroid function.
IMPRESSION: Small heterogeneous thyroid gland demonstrating no discrete
measurable nodules by ultrasound. Nonspecific generalized increased
vascularity throughout both lobes can be associated with thyroiditis
and correlation suggested with TSH and other thyroid function tests.

The above is in keeping with the ACR TI-RADS recommendations - [HOSPITAL] 6052;[DATE].

## 2018-01-16 DIAGNOSIS — N200 Calculus of kidney: Secondary | ICD-10-CM

## 2018-01-16 HISTORY — DX: Calculus of kidney: N20.0

## 2018-02-01 ENCOUNTER — Other Ambulatory Visit: Payer: Self-pay | Admitting: Obstetrics and Gynecology

## 2018-02-01 ENCOUNTER — Ambulatory Visit
Admission: RE | Admit: 2018-02-01 | Discharge: 2018-02-01 | Disposition: A | Discharge status: Home / Self Care | Payer: Managed Care, Other (non HMO) | Source: Ambulatory Visit | Attending: Obstetrics and Gynecology | Admitting: Obstetrics and Gynecology

## 2018-02-01 DIAGNOSIS — N644 Mastodynia: Secondary | ICD-10-CM

## 2018-02-09 ENCOUNTER — Encounter: Payer: Self-pay | Admitting: Internal Medicine

## 2018-02-09 ENCOUNTER — Encounter: Payer: Self-pay | Admitting: Cardiology

## 2018-02-09 DIAGNOSIS — R002 Palpitations: Secondary | ICD-10-CM

## 2018-02-15 ENCOUNTER — Encounter: Payer: Self-pay | Admitting: Internal Medicine

## 2018-02-16 ENCOUNTER — Ambulatory Visit: Payer: Managed Care, Other (non HMO) | Admitting: Internal Medicine

## 2018-02-24 ENCOUNTER — Encounter: Payer: Self-pay | Admitting: Internal Medicine

## 2018-02-24 ENCOUNTER — Ambulatory Visit (INDEPENDENT_AMBULATORY_CARE_PROVIDER_SITE_OTHER): Payer: Managed Care, Other (non HMO)

## 2018-02-24 DIAGNOSIS — R002 Palpitations: Secondary | ICD-10-CM

## 2018-02-25 ENCOUNTER — Telehealth: Payer: Self-pay | Admitting: *Deleted

## 2018-02-25 NOTE — Telephone Encounter (Signed)
Patient having problems with [employer] BG monitor.  Monitor keeps saying poor contact.  [employer] sending patient a new #2 monitor, she will receive tomorrow.  Patient applied #1 monitor in new location and so far it seems to be working.  Told patient I believe it was actually the electrode placement and not the monitor.  Patient will call me back today if she continues to have problems.  If that is the case, I will contact [employer] today to ship a Verite or BGONE wired monitor.

## 2018-02-28 ENCOUNTER — Telehealth: Payer: Self-pay | Admitting: Student

## 2018-02-28 NOTE — Telephone Encounter (Signed)
    Received a call from [employer] that the patient had activated buttons for chest pain, dyspnea, and syncope and they were unable to reach the patient. Her monitor showed sinus tachycardia with HR of 114 at that time.   I was able to reach the patient and confirmed she DID NOT have chest pain, dyspnea, or syncope. Says she was trying to activate the button for dizziness but must have hit the wrong buttons. Reports feeling back to baseline at this time.  Patient was appreciative of the return call.  Signed, Ellsworth LennoxBrittany M Setsuko Robins, PA-C 02/28/2018, 12:24 PM Pager: [phone]

## 2018-03-01 ENCOUNTER — Encounter: Payer: Self-pay | Admitting: *Deleted

## 2018-03-01 NOTE — Progress Notes (Signed)
Received event monitor recordings from 02/28/18. 9:39 am and 9:41 am.  Reviewed by Dr. Mayford Knifeurner (DOD).  See previous telephone encounter.  APP called patient 02/28/18.

## 2018-04-27 ENCOUNTER — Ambulatory Visit (INDEPENDENT_AMBULATORY_CARE_PROVIDER_SITE_OTHER): Payer: Managed Care, Other (non HMO) | Admitting: Neurology

## 2018-04-27 ENCOUNTER — Encounter

## 2018-04-27 ENCOUNTER — Encounter: Payer: Self-pay | Admitting: Neurology

## 2018-04-27 VITALS — BP 141/86 | HR 93 | Ht 62.5 in | Wt 98.5 lb

## 2018-04-27 DIAGNOSIS — W19XXXD Unspecified fall, subsequent encounter: Secondary | ICD-10-CM

## 2018-04-27 DIAGNOSIS — M6289 Other specified disorders of muscle: Secondary | ICD-10-CM

## 2018-04-27 DIAGNOSIS — M6281 Muscle weakness (generalized): Secondary | ICD-10-CM

## 2018-04-27 DIAGNOSIS — R634 Abnormal weight loss: Secondary | ICD-10-CM

## 2018-04-27 DIAGNOSIS — R55 Syncope and collapse: Secondary | ICD-10-CM

## 2018-04-27 DIAGNOSIS — M6250 Muscle wasting and atrophy, not elsewhere classified, unspecified site: Secondary | ICD-10-CM

## 2018-04-27 NOTE — Patient Instructions (Addendum)
Lab test CK today Will send a test to home for evaluation of genetic muscular dystrophies  Electromyoneurogram Electromyoneurogram is a test to check how well your muscles and nerves are working. This procedure includes the combined use of electromyogram (EMG) and nerve conduction study (NCS). EMG is used to look for muscular disorders. NCS, which is also called electroneurogram, measures how well your nerves are controlling your muscles. The procedures are usually performed together to check if your muscles and nerves are healthy. If the reaction to testing is abnormal, this can indicate disease or injury, such as peripheral nerve damage. Tell a health care provider about:  Any allergies you have.  All medicines you are taking, including vitamins, herbs, eye drops, creams, and over-the-counter medicines.  Any problems you or family members have had with anesthetic medicines.  Any blood disorders you have.  Any surgeries you have had.  Any medical conditions you have.  Any pacemaker you have. What are the risks? Generally, this is a safe procedure. However, problems may occur, including:  Infection where the electrodes were inserted.  Bleeding.  What happens before the procedure?  Ask your health care provider about: ? Changing or stopping your regular medicines. This is especially important if you are taking diabetes medicines or blood thinners. ? Taking medicines such as aspirin and ibuprofen. These medicines can thin your blood. Do not take these medicines before your procedure if your health care provider instructs you not to.  Your health care provider may ask you to avoid: ? Caffeine, such as coffee and tea. ? Nicotine. This includes cigarettes and anything with tobacco.  Do not use lotions or creams on the same day that you will be having the procedure. What happens during the procedure? For EMG:  Your health care provider will ask you to stay in a position so  that he or she can access the muscle that will be studied. You may be standing, sitting down, or lying down.  You may be given a medicine that numbs the area (local anesthetic).  A very thin needle that has an electrode on it will be inserted into your muscle.  Another small electrode will be placed on your skin near the muscle.  Your health care provider will ask you to continue to remain still.  The electrodes will send a signal that tells about the electrical activity of your muscles. You may see this on a monitor or hear it in the room.  After your muscles have been studied at rest, your health care provider will ask you to contract or flex your muscles. The electrodes will send a signal that tells about the electrical activity of your muscles.  Your health care provider will remove the electrodes and the electrode needles when the procedure is finished. The procedure may vary among health care providers and hospitals. For NCS:  An electrode that records your nerve activity (recording electrode) will be placed on your skin by the muscle that is being studied.  An electrode that is used as a reference (reference electrode) will be placed near the recording electrode.  A paste or gel will be applied to your skin between the recording electrode and the reference electrode.  Your nerve will be stimulated with a mild shock. Your health care provider will measure how much time it takes for your muscle to react.  Your health care provider will remove the electrodes and the gel when the procedure is finished. The procedure may vary among health  care providers and hospitals. What happens after the procedure?  It is your responsibility to obtain your test results. Ask your health care provider or the department performing the test when and how you will get your results.  Your health care provider may: ? Give you medicines for any pain. ? Monitor the insertion sites to make sure that they  stop bleeding. This information is not intended to replace advice given to you by your health care provider. Make sure you discuss any questions you have with your health care provider. Document Released: 12/05/2004 Document Revised: 01/10/2016 Document Reviewed: 09/25/2014 Elsevier Interactive Patient Education  2018 [employer].  Vasovagal Syncope, Adult Syncope, which is commonly known as fainting or passing out, is a temporary loss of consciousness. It occurs when the blood flow to the brain is reduced. Vasovagal syncope, also called neurocardiogenic syncope, is a fainting spell that happens when blood flow to the brain is reduced because of a sudden drop in heart rate and blood pressure. Vasovagal syncope is usually harmless. However, you can get injured if you fall during a fainting spell. What are the causes? This condition is caused by a drop in heart rate and blood pressure, usually in response to a trigger. Many things and situations can trigger an episode, including:  Pain.  Fear.  The sight of blood. This may occur during medical procedures, such as when blood is being drawn from a vein.  Common activities, such as coughing, swallowing, stretching, or going to the bathroom.  Emotional stress.  Being in a confined space.  Prolonged standing, especially in a warm environment.  Lack of sleep or rest.  Not eating for a long time.  Not drinking enough liquids.  Recent illness.  Drinking alcohol.  Taking drugs that affect blood pressure, such as marijuana, cocaine, opiates, or inhalants.  What are the signs or symptoms? Before a fainting episode, you may:  Feel dizzy or light-headed.  Become pale.  Sense that you are going to faint.  Feel like the room is spinning.  Only see directly ahead (tunnel vision).  Feel sick to your stomach (nauseous).  See spots.  Slowly lose vision.  Hear ringing in your ears.  Have a headache.  Feel warm and  sweaty.  Feel a sensation of pins and needles.  During the fainting spell, you may twitch or make jerky movements. Fainting spells usually last no longer than a few minutes before you wake up. If you get up too quickly before your body can recover, you may faint again. How is this diagnosed? This condition is diagnosed based on your symptoms, your medical history, and a physical exam. Tests may be done to rule out other causes of fainting. Tests may include:  Blood tests.  Heart tests, such as an electrocardiogram (ECG), echocardiogram, or electrophysiology study.  A test to check your response to changes in position (tilt table test).  How is this treated? Usually, treatment is not needed for this condition. Your health care provider may suggest ways to help prevent fainting episodes. These may include:  Drinking additional fluids if you are exposed to a trigger.  Sitting or lying down if you notice signs that an episode is coming.  If your fainting spells continue, your health care provider may recommend that you:  Take medicines to prevent fainting or to help reduce further episodes of fainting.  Do certain exercises.  Wear compression stockings.  Have surgery to place a pacemaker in your body (rare).  Follow  these instructions at home:  Learn to identify the signs that an episode is coming.  Sit or lie down at the first sign of a fainting spell. If you sit down, put your head down between your legs. If you lie down, swing your legs up in the air to increase blood flow to the brain.  Avoid hot tubs and saunas.  Avoid standing for a long time. If you have to stand for a long time, try: ? Crossing your legs. ? Flexing and stretching your leg muscles. ? Squatting. ? Moving your legs. ? Bending over.  Drink enough fluid to keep your urine clear or pale yellow.  Make changes to your diet that your health care provider recommends. You may be told to: ? Avoid  caffeine. ? Eat more salt.  Take over-the-counter and prescription medicines only as told by your health care provider. Contact a health care provider if:  You continue to have fainting spells despite treatment.  You faint more often despite treatment.  You lose consciousness for more than a few minutes.  You faint during or after exercising or after being startled.  You have twitching or jerky movements for longer than a few seconds during a fainting spell.  You have an episode of twitching or jerky movements without fainting. Get help right away if:  A fainting spell leads to an injury or bleeding.  You have new symptoms that occur with the fainting spells, such as: ? Shortness of breath. ? Chest pain. ? Irregular heartbeat.  You twitch or make jerky movements for more than 5 minutes.  You twitch or make jerky movements during more than one fainting spell. This information is not intended to replace advice given to you by your health care provider. Make sure you discuss any questions you have with your health care provider. Document Released: 07/21/2012 Document Revised: 01/16/2016 Document Reviewed: 06/02/2015 Elsevier Interactive Patient Education  [employer].

## 2018-04-27 NOTE — Progress Notes (Signed)
Katie Brewer    Provider:  Dr Lucia Brewer Referring Provider: Juluis Rainier, MD Primary Care Physician:  Katie Rainier, MD  CC:  New problem leg numbness  HPI: New issue today. Legs weakness. Falling several times. Having trouble getting back up bc of leg weakness. Legs feel weak all the time. Personally reviewed MRI images of the brain which were normal. She is "greying out more". She will be standing, feel like she is going to faint, starts feeling lightheaded, feels dizzy, off balance, always standing when happens, feels like the world starts coming in on her, she doesn't lose consciousness, she stays awake. She feels dizzy, she is spinning. No problems swallowing, no facial weakness, no hx of muscular dystrophy in the family.  She has muscle wasting, ongoing for a year but recently has been worsening, weakness progressing, near falls, continuous, no known etiology or inciting event, nothing males the weakness better or worse. Hisband is here and provides information.  HPI:  Katie Brewer is a 57 y.o. female here as a referral from Katie. Zachery Brewer for Syncope and Collapse.  Past medical history of tachycardia, supraventricular tachycardia, mitral valve prolapse, hypothyroidism, hypertension, hyperlipidemia, Hashimoto's thyroiditis.  She has what appears to be significant situational anxiety, and has described her emotions as "being out of control" in the past. Last year about this time she was walking >10,000 steps a day, in March she would get head "blips", they feel like her stomach drops like on a nelevator, she feels it in her head, it may happen multiple times, her husband said she "went grey", like her brain is going to shut down and she feels a surge to stay focused, she has to lay down, she had a monitor and had an episode, 95% of the time it happened after walking 30-45 minutes, she hydrates, she eats, then she started having digestive issues and has lost weight, not on  thyroid medication.  She saw the endocrinologist, she stopped the thyroid medication and after 6 weeks she felt better, she was eating, she felt like she had her life back. She felt great. She felt a "flip flop" , she felt her vision getting smaller, hot, palpitations, greying out. She was told it was vasovagal. She started synthroid, symptoms worse. She has pulsatile tinnitus. She has had episodes of alteration of awareness in the past with confusion. She has loss of vision. Denies any current tremor.  [name] with her friend who also provides information.  Reviewed notes, labs and imaging from outside physicians, which showed:  Reviewed primary care notes.  CBC unremarkable, CMP June 2018 showed slightly elevated glucose, creatinine 0.59, BUN 11, otherwise normal.  Having presyncopal episodes.  She has tachycardia, heart rates up to 118, she has had a 20-48-hour Holter monitor which was reportedly normal, she has an echocardiogram and an electrophysiology consult with Katie. Graciela Brewer schedule soon, she had a near syncopal episode while driving in September, she is noted her heart flip flopping, 2 episodes on Saturday where she "great out" but did not actually pass out.  She has described her emotions as "being out of control", she has been crying more recently. Review of Systems: Patient complains of symptoms per HPI as well as the following symptoms: Weight loss, blurred vision, palpitations, feeling,, feeling cold, flushing, constipation, depression, anxiety. Pertinent negatives and positives per HPI. All others negative.   Social History   Socioeconomic History  . Marital status: Married    Spouse name: Not on file  .  Number of children: 2  . Years of education: Not on file  . Highest education level: Master's degree (e.g., MA, MS, MEng, MEd, MSW, MBA)  Occupational History  . Occupation: Primary school teacher  Social Needs  . Financial resource strain: Not on file  . Food insecurity:     Worry: Not on file    Inability: Not on file  . Transportation needs:    Medical: Not on file    Non-medical: Not on file  Tobacco Use  . Smoking status: Never Smoker  . Smokeless tobacco: Never Used  Substance and Sexual Activity  . Alcohol use: No  . Drug use: No  . Sexual activity: Not on file  Lifestyle  . Physical activity:    Days per week: Not on file    Minutes per session: Not on file  . Stress: Not on file  Relationships  . Social connections:    Talks on phone: Not on file    Gets together: Not on file    Attends religious service: Not on file    Active member of club or organization: Not on file    Attends meetings of clubs or organizations: Not on file    Relationship status: Not on file  . Intimate partner violence:    Fear of current or ex partner: Not on file    Emotionally abused: Not on file    Physically abused: Not on file    Forced sexual activity: Not on file  Other Topics Concern  . Not on file  Social History Narrative   Caffeine daily--tea    Lives at home with her husband. Her daughter is living with them for now.    Right handed    Family History  Problem Relation Age of Onset  . Diabetes type I Son   . Diabetes type II Father        and mother   . Heart disease Father   . High blood pressure Father   . Skin cancer Father        non-melanoma  . Ovarian cancer Mother 7  . Skin cancer Mother        non-melanoma  . Renal cancer Sister        died of neuroblastoma at age 62-11y; diagnosed 1 year before  . Breast cancer Maternal Aunt 21  . Lung cancer Maternal Uncle        not a smoker; worked around tobacco  . Stomach cancer Paternal Uncle 21  . Testicular cancer Paternal Uncle        dx. 30s  . Heart attack Maternal Grandfather        d. 54s  . Kidney Stones Paternal Grandmother        had to have a kidney removed  . Stroke Paternal Grandfather        d. 89  . Heart attack Paternal Grandfather        smoker  . Other Daughter          gluten sensitivity  . Heart disease Maternal Uncle        d. 30s  . Breast cancer Cousin        maternal 1st cousin dx breast cancer at 74-62; w/ mets  . Colon cancer Neg Hx     Past Medical History:  Diagnosis Date  . Diverticulosis   . Hashimoto's thyroiditis   . Hemorrhoids   . History of chicken pox   . History of cystocele   .  Hyperlipidemia   . Hypertension   . Hypothyroidism   . Kidney stone 01/2018  . MVP (mitral valve prolapse)   . Rectocele   . SVT (supraventricular tachycardia) (HCC)   . Tachycardia   . Uterine prolapse     Past Surgical History:  Procedure Laterality Date  . APPENDECTOMY  1976  . BREAST BIOPSY Left 1996  . CHOLECYSTECTOMY  2012  . TONSILLECTOMY  1981    Current Outpatient Medications  Medication Sig Dispense Refill  . Cholecalciferol (VITAMIN D3) 1000 UNITS tablet Take 1,000 Units by mouth daily.      . Digestive Enzymes (DIGESTIVE ENZYME PO) Take 1 tablet by mouth every morning. 4 mornings per week    . estazolam (PROSOM) 2 MG tablet Take 1 mg by mouth at bedtime as needed.     . fludrocortisone (FLORINEF) 0.1 MG tablet Take 0.025 mg by mouth daily.    . NP THYROID PO Take by mouth. 60 mg in the morning 15 mg in the afternoon for total of 75 mg daily     No current facility-administered medications for this visit.     Allergies as of 04/27/2018 - Review Complete 04/27/2018  Allergen Reaction Noted  . Meperidine  05/29/2015  . Shellfish allergy Nausea And Vomiting 07/28/2015  . Ampicillin Rash 01/24/2014  . Demerol  02/05/2011  . Erythromycin  02/05/2011  . Influenza vaccines  07/03/2016  . Penicillins  02/05/2011  . Sulfa antibiotics  01/24/2014  . Sulfa drugs cross reactors  02/05/2011  . Thimerosal  06/06/2013    Vitals: BP (!) 141/86 (BP Location: Right Arm, Patient Position: Sitting)   Pulse 93   Ht 5' 2.5" (1.588 m)   Wt 98 lb 8 oz (44.7 kg)   LMP 06/06/2009   BMI 17.73 kg/m  Last Weight:  Wt Readings from  Last 1 Encounters:  04/27/18 98 lb 8 oz (44.7 kg)   Last Height:   Ht Readings from Last 1 Encounters:  04/27/18 5' 2.5" (1.588 m)   Physical exam: Exam: Gen: conversant, well nourised, thin, anxious and teary     CV: RRR, no MRG. No Carotid Bruits. No peripheral edema, warm, nontender Eyes: Conjunctivae clear without exudates or hemorrhage  Neuro: Detailed Neurologic Exam  Speech:    Speech is normal; fluent and spontaneous with normal comprehension.  Cognition:    The patient is oriented to person, place, and time;     recent and remote memory intact;     language fluent;     normal attention, concentration,     fund of knowledge Cranial Nerves:    The pupils are equal, round, and reactive to light. Attempted fundoscopic exam could not visualize.  Visual fields are full to finger confrontation. Extraocular movements are intact. Trigeminal sensation is intact and the muscles of mastication are normal. The face is symmetric. The palate elevates in the midline. Hearing intact. Voice is normal. Shoulder shrug is normal. The tongue has normal motion without fasciculations.   Coordination:    No dysmetria  Gait:    Not ataxic  Motor Observation:    Generalized atrophy Tone:    Decreased muscle tone.    Posture:    Posture is normal. normal erect    Strength: weakness and giveway proximal > distal and lower > upper. Distally she is 5/5.       Sensation: intact to LT, pin prick     Reflex Exam:  DTR's:    Deep tendon reflexes in the  upper and lower extremities are brisk bilaterally.   Toes:    The toes are equivocal bilaterally.   Clonus:    Clonus is absent.       Assessment/Plan:  Patient evaluated in the past for pre-syncope, dizziness, vision loss, unclear strange feelings in her head, pulsatile tinnitus, alteration of awareness. Today she is here for a new problem, reports worsening weakness,progressive weight loss, muscle atrophy.  Weakness and brisk  reflexes as well as vertigo and imbalance: Needs MRI brain w/wo contrast with thin cuts through the IACs. Also recommend MRI cervical spine. She would like to hold off at this time.  Needs to follow up with Katie. Graciela Brewer for syncope sounds like vasovagal  Her legs "get purple", may be vascular issues needs follow up with pcp  She has been extensively tested at [facility] and is getting Rheum workup and other testing next week.  She saw oncology and endocrinology. She is seeing a doctor soon for lab testing for autoimmune.   Will check a CK. Will order EMG/NCS to evaluate for neuromuscular disease. Also may consider genetic testing for Muscular Dystrophy.  Orders Placed This Encounter  Procedures  . CK  . NCV with EMG(electromyography)     Katie Dean, MD  [facility] [address] [place], [place] [address]  Phone [phone] Fax [phone]

## 2018-04-28 LAB — CK: Total CK: 40 U/L (ref 24–173)

## 2018-05-03 ENCOUNTER — Ambulatory Visit: Payer: Managed Care, Other (non HMO) | Admitting: Internal Medicine

## 2018-05-05 ENCOUNTER — Encounter: Payer: Self-pay | Admitting: Internal Medicine

## 2018-05-05 ENCOUNTER — Ambulatory Visit (INDEPENDENT_AMBULATORY_CARE_PROVIDER_SITE_OTHER): Payer: Managed Care, Other (non HMO) | Admitting: Internal Medicine

## 2018-05-05 VITALS — BP 106/74 | HR 106 | Ht 62.5 in | Wt 97.4 lb

## 2018-05-05 DIAGNOSIS — I341 Nonrheumatic mitral (valve) prolapse: Secondary | ICD-10-CM | POA: Diagnosis not present

## 2018-05-05 DIAGNOSIS — R002 Palpitations: Secondary | ICD-10-CM

## 2018-05-05 DIAGNOSIS — R000 Tachycardia, unspecified: Secondary | ICD-10-CM | POA: Diagnosis not present

## 2018-05-05 NOTE — Patient Instructions (Signed)
Medication Instructions:  Your physician recommends that you continue on your current medications as directed. Please refer to the Current Medication list given to you today.  Labwork: None ordered.  Testing/Procedures: None ordered.  Follow-Up: Your physician wants you to follow-up as needed with Dr Klein.  Any Other Special Instructions Will Be Listed Below (If Applicable).     If you need a refill on your cardiac medications before your next appointment, please call your pharmacy.  

## 2018-05-05 NOTE — Progress Notes (Signed)
Patient Care Team: Juluis Rainier, MD as PCP - General (Family Medicine)   HPI  Katie Brewer is a 57 y.o. female Seen in follow-up for a challenging set of symptoms.  She has had tachycardia thyroid issues (autoimmune thyroiditis) , she has had significant GI issues with weight loss anorexia nausea, she had these things cold lips and her head "which her son, an Art gallery manager thought he would be able to better understand stand, by applying some of his math skills to understand the frequency.  I had suggested given the constellation of her complaints that perhaps a Medical Center like [facility] might afford opportunity for understanding.   In the interim she has been seen by endocrine at [facility].  Work-up for " pheochromocytoma, hypercortisolemia, hyperparathyroidism, hypocortisolemia was all normal. She still endorses dizziness, lightheadedness, feeling imbalance, and numbness of her legs. Her dizziness is worse, and numbness is new recently. "  GI evaluation was unrevealing  Seen by neurology 9/19 for worsening lower leg weakness and falls.  EMG/NCS was ordered for neuromuscular disease I was unable to determine from the note as to whether there was objective proximal muscle weakness or give way weakness.  Event recorder>>Symptoms of rapid heart beat//Lightheadedness ass with sinus tach HR 105-134 Symptoms of syncope assoc with SInus rhythm 114 bpm Symptoms of flutter assoc with sinus rhythm APACs  She has had interval falls and syncope.  She is aware once she hits the ground.  She is "so weak she cannot get up afterwards."  Has had shower intolerance.  Trying to take cold showers now.  Intolerant to prolonged standing.  She has had interval syncope  Records and Results Reviewed*   Past Medical History:  Diagnosis Date  . Diverticulosis   . Hashimoto's thyroiditis   . Hemorrhoids   . History of chicken pox   . History of cystocele   . Hyperlipidemia   . Hypertension     . Hypothyroidism   . Kidney stone 01/2018  . MVP (mitral valve prolapse)   . Rectocele   . SVT (supraventricular tachycardia) (HCC)   . Tachycardia   . Uterine prolapse     Past Surgical History:  Procedure Laterality Date  . APPENDECTOMY  1976  . BREAST BIOPSY Left 1996  . CHOLECYSTECTOMY  2012  . TONSILLECTOMY  1981    Current Meds  Medication Sig  . Cholecalciferol (VITAMIN D3) 1000 UNITS tablet Take 1,000 Units by mouth daily.    . Digestive Enzymes (DIGESTIVE ENZYME PO) Take 1 tablet by mouth every morning. 4 mornings per week  . estazolam (PROSOM) 2 MG tablet Take 1 mg by mouth at bedtime as needed.   . fludrocortisone (FLORINEF) 0.1 MG tablet Take 0.025 mg by mouth daily.  . NP THYROID PO Take by mouth. 60 mg in the morning  . OVER THE COUNTER MEDICATION Take 100 mg by mouth daily.    Allergies  Allergen Reactions  . Meperidine      Respiratory distress  . Shellfish Allergy Nausea And Vomiting  . Ampicillin Rash  . Demerol   . Erythromycin     Elevated liver enzymes  . Influenza Vaccines   . Penicillins   . Sulfa Antibiotics     Elevated liver enzymes  . Sulfa Drugs Cross Reactors   . Thimerosal       Review of Systems negative except from HPI and PMH  Physical Exam BP 106/74   Pulse (!) 106   Ht 5' 2.5" (  1.588 m)   Wt 97 lb 6.4 oz (44.2 kg)   LMP 06/06/2009   SpO2 97%   BMI 17.53 kg/m  Well developed and nourished in no acute distress HENT normal Neck supple with JVP-flat Clear Regular rate and rhythm, no murmurs or gallops Abd-soft with active BS No Clubbing cyanosis edema Skin-warm and dry A & Oriented  Grossly normal sensory and motor function   ECG Sinus @ 106 13/07/33  Assessment and  Plan  Tachycardia longstanding  Syncope without documented arrhythmia   Hypothyroidism  Rx  Anorexia and nausea//Weight loss  Frustration  Anxiety     She continues to have worsening symptoms and postural intolerance.  Does not  have objective evidence of orthostatic intolerance; reviewing her event recorder she does have heart rate excursion consistent with inappropriate sinus tachycardia.  Her tachycardia however dates back at least to 2011 and more than half of her heart rate in the [facility] system paced are faster than 100 bpm   \it is thus hard to implicate the tachycardia of long-standing to her newly disruptive symptoms.  Reviewed event recorder with patient and her daughter  She has taken beta-blockers in the past but did not tolerate metoprolol.  In the context of what appears to be objective neurological concerns; was not quite sure from the note, regarding muscle wasting and muscle weakness and given her ongoing weight loss despite good appetite, other primary diagnoses should be sought.  We did discuss the possibility of empiric beta-blocker trials;  she is not inclined and neither am I  Again have reitierated the potential value of a place like [facility],  We will see her again as needed   More than 50% of 40 min was spent in counseling related to the above

## 2018-05-06 ENCOUNTER — Telehealth: Payer: Self-pay | Admitting: *Deleted

## 2018-05-06 NOTE — Telephone Encounter (Signed)
[employer] detect neuromuscular genetic panel referral form completed and signed by Dr. Lucia GaskinsAhern. Received a receipt of confirmation.

## 2018-05-20 ENCOUNTER — Ambulatory Visit (INDEPENDENT_AMBULATORY_CARE_PROVIDER_SITE_OTHER): Payer: Managed Care, Other (non HMO) | Admitting: Neurology

## 2018-05-20 ENCOUNTER — Telehealth: Payer: Self-pay | Admitting: Neurology

## 2018-05-20 DIAGNOSIS — G959 Disease of spinal cord, unspecified: Secondary | ICD-10-CM

## 2018-05-20 DIAGNOSIS — Z0289 Encounter for other administrative examinations: Secondary | ICD-10-CM

## 2018-05-20 DIAGNOSIS — M6289 Other specified disorders of muscle: Secondary | ICD-10-CM

## 2018-05-20 DIAGNOSIS — M6250 Muscle wasting and atrophy, not elsewhere classified, unspecified site: Secondary | ICD-10-CM

## 2018-05-20 DIAGNOSIS — M6281 Muscle weakness (generalized): Secondary | ICD-10-CM

## 2018-05-20 DIAGNOSIS — W57XXXD Bitten or stung by nonvenomous insect and other nonvenomous arthropods, subsequent encounter: Secondary | ICD-10-CM

## 2018-05-20 DIAGNOSIS — G7080 Lambert-Eaton syndrome, unspecified: Secondary | ICD-10-CM

## 2018-05-20 DIAGNOSIS — R202 Paresthesia of skin: Secondary | ICD-10-CM

## 2018-05-20 NOTE — Progress Notes (Signed)
See procedure note.

## 2018-05-20 NOTE — Telephone Encounter (Signed)
[employer] order sent to GI. They obtain the auth and will reach out to the pt to schedule.  °

## 2018-05-24 NOTE — Progress Notes (Signed)
History: worsening weakness,progressiveweight loss, muscle atrophy. MRI of the brain in 2018 was normal. Recommended MRI cervical spine and emg/ncs. Extensive lab panel pending. Patient is also being evaluated by multiple other doctors including endocrinology and rheumatology. CK was normal.   Had an extensive discussion with patient. MRI brain 06/2017 was normal. CK normal. Will order neuropathy labs today. Also recommended repeat MRI brain and cervical spine, will proceed with MRI cervical spine for now to evaluate for any other causes of her progressive muscle weakness such as cervical stenosis with myelopathy. We are awaiting [employer] results for neuropathy genetic panel and a muscular dystrophy panel however emg/ncs is normal.   Orders Placed This Encounter  Procedures  . MR CERVICAL SPINE WO CONTRAST  . Methylmalonic acid, serum  . Vitamin B1  . B12 and Folate Panel  . Paraneoplastic Profile 1  . Vitamin B6  . Multiple Myeloma Panel (SPEP&IFE w/QIG)  . Lyme Disease, Western Blot  . Acetylcholine receptor, binding  . Acetylcholine receptor, blocking  . Acetylcholine receptor, modulating  . VGCC Antibody   A total of 93mnutes was spent face-to-face with this patient. Over half this time was spent on counseling patient on the  1. Muscular weakness   2. Muscular atrophy, unspecified site   3. Proximal weakness of extremity     diagnosis and different diagnostic and therapeutic options, counseling and coordination of care, risks ans benefits of management, compliance, or risk factor reduction and education.  This does not include time spent on emg/ncs.  Cc: Dr. BDrema Dallas

## 2018-05-24 NOTE — Procedures (Signed)
Full Name: Katie Brewer Gender: Female MRN #: [id number] Date of Birth: June 01, 2061    Visit Date: 05/20/18 10:03 Age: 57 Years 6 Months Old Examining Physician: Naomie Dean, MD  Referring Physician: Juluis Rainier, MD  History: worsening weakness,progressive weight loss, muscle atrophy. MRI of the brain in 2018 was normal. Recommended MRI cervical spine and emg/ncs. Extensive lab panel pending. Patient is also being evaluated by multiple other doctors including endocrinology and rheumatology. CK was normal.  Summary: EMG/NCS was performed on the bilateral lower extremities and right upper extremity. All nerves and muscles were within normal limits.    Conclusion:This is a normal study. No evidence of mononeuropathy,polyneuropathy,radiculopathy, myopathy/myositis or other neuromuscular disorder.   Naomie Dean M.D.  [facility] [address] [place], [place] [address] Tel: [phone] Fax: [phone]        [facility]    Nerve / Sites Muscle Latency Ref. Amplitude Ref. Rel Amp Segments Distance Velocity Ref. Area    ms ms mV mV %  cm m/s m/s mVms  R Median - APB     Wrist APB 3.5 ?4.4 10.6 ?4.0 100 Wrist - APB 7   53.0     Upper arm APB 7.0  10.4  99 Upper arm - Wrist 20 57 ?49 50.6  R Ulnar - ADM     Wrist ADM 2.9 ?3.3 9.1 ?6.0 100 Wrist - ADM 7   24.4     B.Elbow ADM 6.4  8.9  98.8 B.Elbow - Wrist 19 54 ?49 24.4     A.Elbow ADM 8.2  8.5  94.7 A.Elbow - B.Elbow 10 55 ?49 23.4         A.Elbow - Wrist      R Peroneal - EDB     Ankle EDB 4.7 ?6.5 2.4 ?2.0 100 Ankle - EDB 9   7.2     Fib head EDB 10.1  2.1  88 Fib head - Ankle 27 50 ?44 6.2     Pop fossa EDB 12.1  2.0  95.7 Pop fossa - Fib head 10 49 ?44 7.1         Pop fossa - Ankle      L Peroneal - EDB     Ankle EDB 5.0 ?6.5 6.7 ?2.0 100 Ankle - EDB 9   20.9     Fib head EDB 10.6  5.8  87 Fib head - Ankle 27 48 ?44 19.0     Pop fossa EDB 12.4  5.8  100 Pop fossa - Fib head 10 53 ?44 19.0     Pop fossa - Ankle      R Tibial - AH     Ankle AH 4.5 ?5.8 20.3 ?4.0 100 Ankle - AH 9   56.2     Pop fossa AH 12.8  15.0  73.8 Pop fossa - Ankle 36 43 ?41 47.7  L Tibial - AH     Ankle AH 5.0 ?5.8 23.1 ?4.0 100 Ankle - AH 9   64.3     Pop fossa AH 12.6  19.4  83.8 Pop fossa - Ankle 36 47 ?41 59.8                 SNC    Nerve / Sites Rec. Site Peak Lat Ref.  Amp Ref. Segments Distance Peak Diff Ref.    ms ms V V  cm ms ms  R Radial - Anatomical snuff box (Forearm)  Forearm Wrist 2.7 ?2.9 52 ?15 Forearm - Wrist 10    R Sural - Ankle (Calf)     Calf Ankle 4.0 ?4.4 27 ?6 Calf - Ankle 14    L Sural - Ankle (Calf)     Calf Ankle 3.8 ?4.4 31 ?6 Calf - Ankle 14    R Superficial peroneal - Ankle     Lat leg Ankle 3.6 ?4.4 19 ?6 Lat leg - Ankle 14    L Superficial peroneal - Ankle     Lat leg Ankle 3.7 ?4.4 14 ?6 Lat leg - Ankle 14    R Median, Ulnar - Transcarpal comparison     Median Palm Wrist 2.0 ?2.2 43 ?35 Median Palm - Wrist 8       Ulnar Palm Wrist 2.1 ?2.2 23 ?12 Ulnar Palm - Wrist 8          Median Palm - Ulnar Palm  -0.1 ?0.4  R Median - Orthodromic (Dig II, Mid palm)     Dig II Wrist 3.0 ?3.4 29 ?10 Dig II - Wrist 13    R Ulnar - Orthodromic, (Dig V, Mid palm)     Dig V Wrist 2.8 ?3.1 18 ?5 Dig V - Wrist 20                       F  Wave    Nerve F Lat Ref.   ms ms  R Tibial - AH 49.6 ?56.0  L Tibial - AH 50.1 ?56.0  R Ulnar - ADM 25.1 ?32.0              EMG full       EMG Summary Table    Spontaneous MUAP Recruitment  Muscle IA Fib PSW Fasc Other Amp Dur. Poly Pattern  R. Deltoid Normal None None None _______ Normal Normal Normal Normal  R. Triceps brachii Normal None None None _______ Normal Normal Normal Normal  R. Biceps brachii Normal None None None _______ Normal Normal Normal Normal  R. Opponens pollicis Normal None None None _______ Normal Normal Normal Normal  R. Pronator teres Normal None None None _______ Normal Normal Normal Normal  R. Iliopsoas  Normal None None None _______ Normal Normal Normal Normal  R. Vastus medialis Normal None None None _______ Normal Normal Normal Normal  R. Tibialis anterior Normal None None None _______ Normal Normal Normal Normal  Bilateral Gastrocnemius (Medial head) Normal None None None _______ Normal Normal Normal Normal  R. Extensor hallucis longus Normal None None None _______ Normal Normal Normal Normal  Bilateral Cervical paraspinals (low) Normal None None None _______ Normal Normal Normal Normal  Bilateral Lumbar paraspinals Normal None None None _______ Normal Normal Normal Normal  R. Gluteus medius Normal None None None _______ Normal Normal Normal Normal  R Gluteus Max.  Normal None None None _______ Normal Normal Normal Normal

## 2018-05-24 NOTE — Progress Notes (Signed)
      Full Name: Katie  Brewer Gender: Female MRN #: [id number] Date of Birth: 08/13/1961    Visit Date: 05/20/18 10:03 Age: 57 Years 6 Months Old Examining Physician: Mariadelcarmen Corella, MD  Referring Physician: Barnes, Ewelina, MD  History: worsening weakness,progressive weight loss, muscle atrophy. MRI of the brain in 2018 was normal. Recommended MRI cervical spine and emg/ncs. Extensive lab panel pending. Patient is also being evaluated by multiple other doctors including endocrinology and rheumatology. CK was normal.  Summary: EMG/NCS was performed on the bilateral lower extremities and right upper extremity. All nerves and muscles were within normal limits.    Conclusion:This is a normal study. No evidence of mononeuropathy,polyneuropathy,radiculopathy, myopathy/myositis or other neuromuscular disorder.   Katie Brewer M.D.  [facility] [address] [place], [place] [address] Tel: [phone] Fax: [phone]        MNC    Nerve / Sites Muscle Latency Ref. Amplitude Ref. Rel Amp Segments Distance Velocity Ref. Area    ms ms mV mV %  cm m/s m/s mVms  R Median - APB     Wrist APB 3.5 ?4.4 10.6 ?4.0 100 Wrist - APB 7   53.0     Upper arm APB 7.0  10.4  99 Upper arm - Wrist 20 57 ?49 50.6  R Ulnar - ADM     Wrist ADM 2.9 ?3.3 9.1 ?6.0 100 Wrist - ADM 7   24.4     B.Elbow ADM 6.4  8.9  98.8 B.Elbow - Wrist 19 54 ?49 24.4     A.Elbow ADM 8.2  8.5  94.7 A.Elbow - B.Elbow 10 55 ?49 23.4         A.Elbow - Wrist      R Peroneal - EDB     Ankle EDB 4.7 ?6.5 2.4 ?2.0 100 Ankle - EDB 9   7.2     Fib head EDB 10.1  2.1  88 Fib head - Ankle 27 50 ?44 6.2     Pop fossa EDB 12.1  2.0  95.7 Pop fossa - Fib head 10 49 ?44 7.1         Pop fossa - Ankle      L Peroneal - EDB     Ankle EDB 5.0 ?6.5 6.7 ?2.0 100 Ankle - EDB 9   20.9     Fib head EDB 10.6  5.8  87 Fib head - Ankle 27 48 ?44 19.0     Pop fossa EDB 12.4  5.8  100 Pop fossa - Fib head 10 53 ?44 19.0     Pop fossa - Ankle      R Tibial - AH     Ankle AH 4.5 ?5.8 20.3 ?4.0 100 Ankle - AH 9   56.2     Pop fossa AH 12.8  15.0  73.8 Pop fossa - Ankle 36 43 ?41 47.7  L Tibial - AH     Ankle AH 5.0 ?5.8 23.1 ?4.0 100 Ankle - AH 9   64.3     Pop fossa AH 12.6  19.4  83.8 Pop fossa - Ankle 36 47 ?41 59.8                 SNC    Nerve / Sites Rec. Site Peak Lat Ref.  Amp Ref. Segments Distance Peak Diff Ref.    ms ms V V  cm ms ms  R Radial - Anatomical snuff box (Forearm)       Forearm Wrist 2.7 ?2.9 52 ?15 Forearm - Wrist 10    R Sural - Ankle (Calf)     Calf Ankle 4.0 ?4.4 27 ?6 Calf - Ankle 14    L Sural - Ankle (Calf)     Calf Ankle 3.8 ?4.4 31 ?6 Calf - Ankle 14    R Superficial peroneal - Ankle     Lat leg Ankle 3.6 ?4.4 19 ?6 Lat leg - Ankle 14    L Superficial peroneal - Ankle     Lat leg Ankle 3.7 ?4.4 14 ?6 Lat leg - Ankle 14    R Median, Ulnar - Transcarpal comparison     Median Palm Wrist 2.0 ?2.2 43 ?35 Median Palm - Wrist 8       Ulnar Palm Wrist 2.1 ?2.2 23 ?12 Ulnar Palm - Wrist 8          Median Palm - Ulnar Palm  -0.1 ?0.4  R Median - Orthodromic (Dig II, Mid palm)     Dig II Wrist 3.0 ?3.4 29 ?10 Dig II - Wrist 13    R Ulnar - Orthodromic, (Dig V, Mid palm)     Dig V Wrist 2.8 ?3.1 18 ?5 Dig V - Wrist 11                       F  Wave    Nerve F Lat Ref.   ms ms  R Tibial - AH 49.6 ?56.0  L Tibial - AH 50.1 ?56.0  R Ulnar - ADM 25.1 ?32.0              EMG full       EMG Summary Table    Spontaneous MUAP Recruitment  Muscle IA Fib PSW Fasc Other Amp Dur. Poly Pattern  R. Deltoid Normal None None None _______ Normal Normal Normal Normal  R. Triceps brachii Normal None None None _______ Normal Normal Normal Normal  R. Biceps brachii Normal None None None _______ Normal Normal Normal Normal  R. Opponens pollicis Normal None None None _______ Normal Normal Normal Normal  R. Pronator teres Normal None None None _______ Normal Normal Normal Normal  R. Iliopsoas  Normal None None None _______ Normal Normal Normal Normal  R. Vastus medialis Normal None None None _______ Normal Normal Normal Normal  R. Tibialis anterior Normal None None None _______ Normal Normal Normal Normal  Bilateral Gastrocnemius (Medial head) Normal None None None _______ Normal Normal Normal Normal  R. Extensor hallucis longus Normal None None None _______ Normal Normal Normal Normal  Bilateral Cervical paraspinals (low) Normal None None None _______ Normal Normal Normal Normal  Bilateral Lumbar paraspinals Normal None None None _______ Normal Normal Normal Normal  R. Gluteus medius Normal None None None _______ Normal Normal Normal Normal  R Gluteus Max.  Normal None None None _______ Normal Normal Normal Normal      

## 2018-05-28 LAB — LYME DISEASE, WESTERN BLOT
IGG P18 AB.: ABSENT
IGG P30 AB.: ABSENT
IGG P39 AB.: ABSENT
IGG P58 AB.: ABSENT
IGG P66 AB.: ABSENT
IGG P93 AB.: ABSENT
IGM P39 AB.: ABSENT
IgG P23 Ab.: ABSENT
IgG P28 Ab.: ABSENT
IgG P41 Ab.: ABSENT
IgG P45 Ab.: ABSENT
IgM P23 Ab.: ABSENT
IgM P41 Ab.: ABSENT
Lyme IgG Wb: NEGATIVE
Lyme IgM Wb: NEGATIVE

## 2018-05-28 LAB — ACETYLCHOLINE RECEPTOR, MODULATING: Acetylcholine Modulat Ab: <12 % (ref 0–20)

## 2018-05-28 LAB — PARANEOPLASTIC PROFILE 1
Neuronal Nuc Ab (Ri), IFA: <1:10 {titer}
Neuronal Nuclear (Hu) Antibody (IB): <1:10 {titer}
Purkinje Cell (Yo) Autoantobodies- IFA: <1:10 {titer}

## 2018-05-28 LAB — B12 AND FOLATE PANEL
Folate: 10.1 ng/mL (ref >3.0)
Vitamin B-12: 727 pg/mL (ref 232–1245)

## 2018-05-28 LAB — MULTIPLE MYELOMA PANEL, SERUM
ALPHA 1: 0.2 g/dL (ref 0.0–0.4)
ALPHA2 GLOB SERPL ELPH-MCNC: 0.7 g/dL (ref 0.4–1.0)
Albumin SerPl Elph-Mcnc: 4 g/dL (ref 2.9–4.4)
Albumin/Glob SerPl: 1.4 (ref 0.7–1.7)
B-Globulin SerPl Elph-Mcnc: 1 g/dL (ref 0.7–1.3)
GAMMA GLOB SERPL ELPH-MCNC: 1 g/dL (ref 0.4–1.8)
GLOBULIN, TOTAL: 2.9 g/dL (ref 2.2–3.9)
IGG (IMMUNOGLOBIN G), SERUM: 925 mg/dL (ref 700–1600)
IgA/Immunoglobulin A, Serum: 219 mg/dL (ref 87–352)
IgM (Immunoglobulin M), Srm: 22 mg/dL — ABNORMAL LOW (ref 26–217)
Total Protein: 6.9 g/dL (ref 6.0–8.5)

## 2018-05-28 LAB — ACETYLCHOLINE RECEPTOR, BLOCKING: ACETYLCHOL BLOCK AB: 11 % (ref 0–25)

## 2018-05-28 LAB — VITAMIN B1: Thiamine: 122.7 nmol/L (ref 66.5–200.0)

## 2018-05-28 LAB — VGCC ANTIBODY: VGCC Antibody: NEGATIVE

## 2018-05-28 LAB — ACETYLCHOLINE RECEPTOR, BINDING

## 2018-05-28 LAB — METHYLMALONIC ACID, SERUM: METHYLMALONIC ACID: 172 nmol/L (ref 0–378)

## 2018-05-28 LAB — VITAMIN B6: VITAMIN B6: 15.1 ug/L (ref 2.0–32.8)

## 2018-06-07 NOTE — Telephone Encounter (Signed)
Rutherford Nail: [id number] (exp. 06/03/18 to 09/01/18) pt is scheduled at GI for 06/08/18.

## 2018-06-08 ENCOUNTER — Ambulatory Visit
Admission: RE | Admit: 2018-06-08 | Discharge: 2018-06-08 | Disposition: A | Discharge status: Home / Self Care | Payer: Managed Care, Other (non HMO) | Source: Ambulatory Visit | Attending: Neurology | Admitting: Neurology

## 2018-06-08 DIAGNOSIS — M6289 Other specified disorders of muscle: Secondary | ICD-10-CM

## 2018-06-08 DIAGNOSIS — R202 Paresthesia of skin: Secondary | ICD-10-CM | POA: Diagnosis not present

## 2018-06-08 DIAGNOSIS — M6250 Muscle wasting and atrophy, not elsewhere classified, unspecified site: Secondary | ICD-10-CM

## 2018-06-08 DIAGNOSIS — G959 Disease of spinal cord, unspecified: Secondary | ICD-10-CM

## 2018-07-06 IMAGING — MG DIGITAL DIAGNOSTIC BILATERAL MAMMOGRAM WITH TOMO AND CAD
8 series · 8 of 24 positions shown · non-contrast
Comparison: 09/29/2016 and earlier

CLINICAL DATA: The patient reports pain in the UPPER INNER QUADRANT
of the LEFT breast, first noted in March 2017. The patient was
subsequently diagnosed with Hashimoto's thyroiditis. Pain in the
LEFT breast improved but has returned. The patient has lost 40
pounds in the last year.

EXAM:
DIGITAL DIAGNOSTIC BILATERAL MAMMOGRAM WITH CAD AND TOMO
ULTRASOUND LEFT BREAST

[R CC synth-2D]
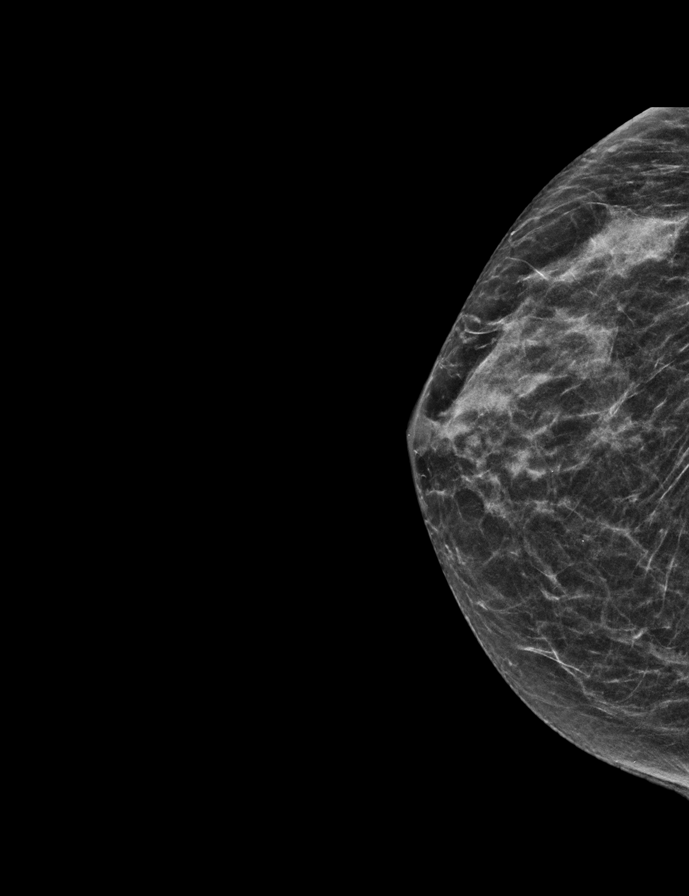

[L MLO synth-2D]
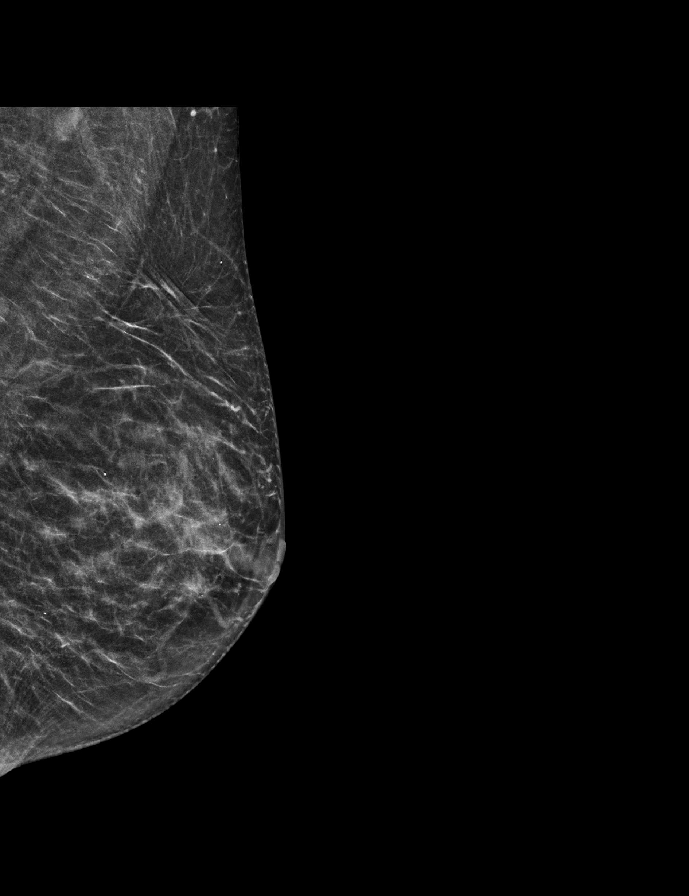

[R MLO synth-2D]
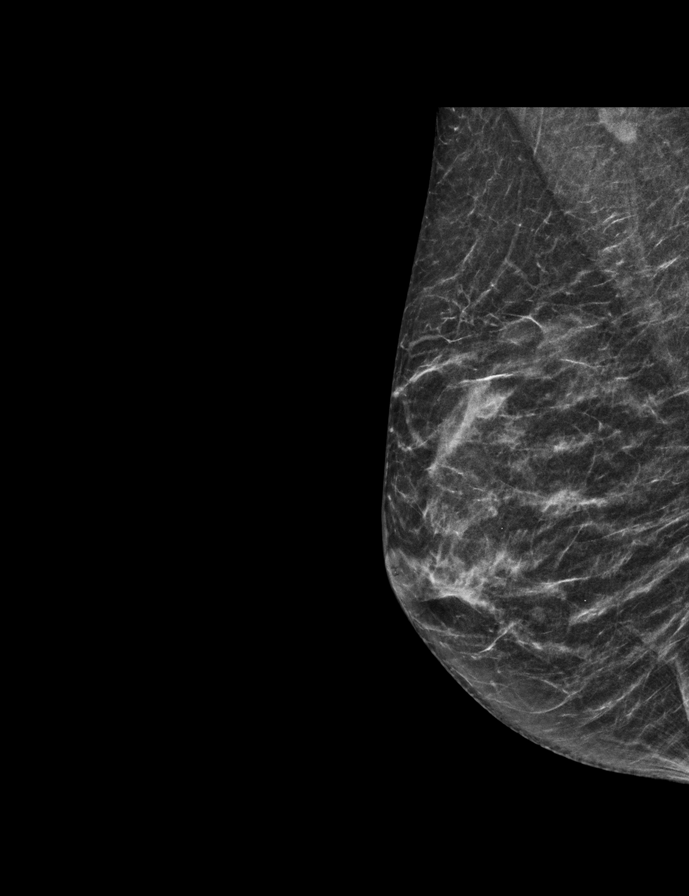

[L CC synth-2D]
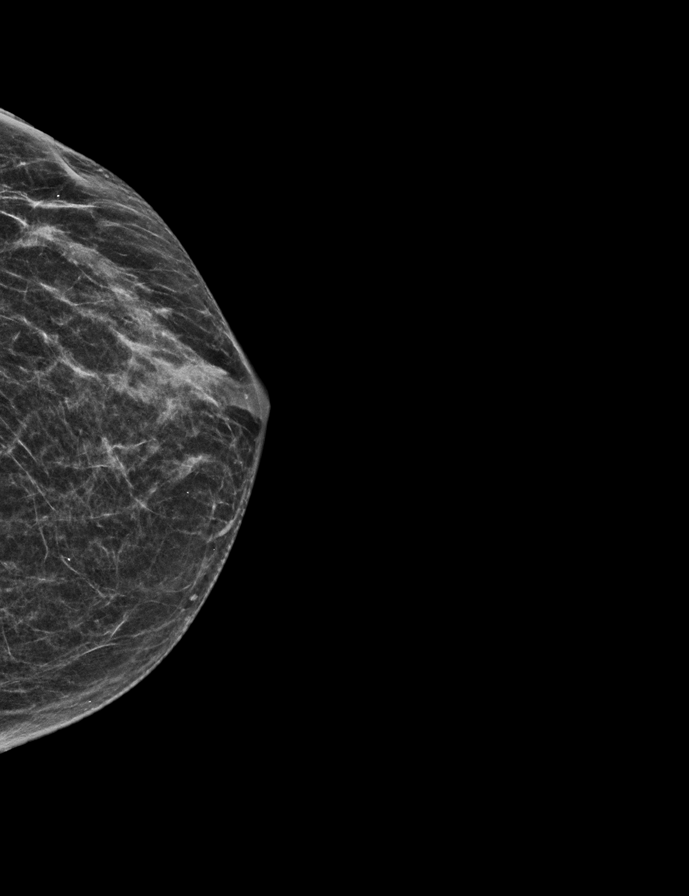

[L CC tomo · tomo slice 21/40.0]
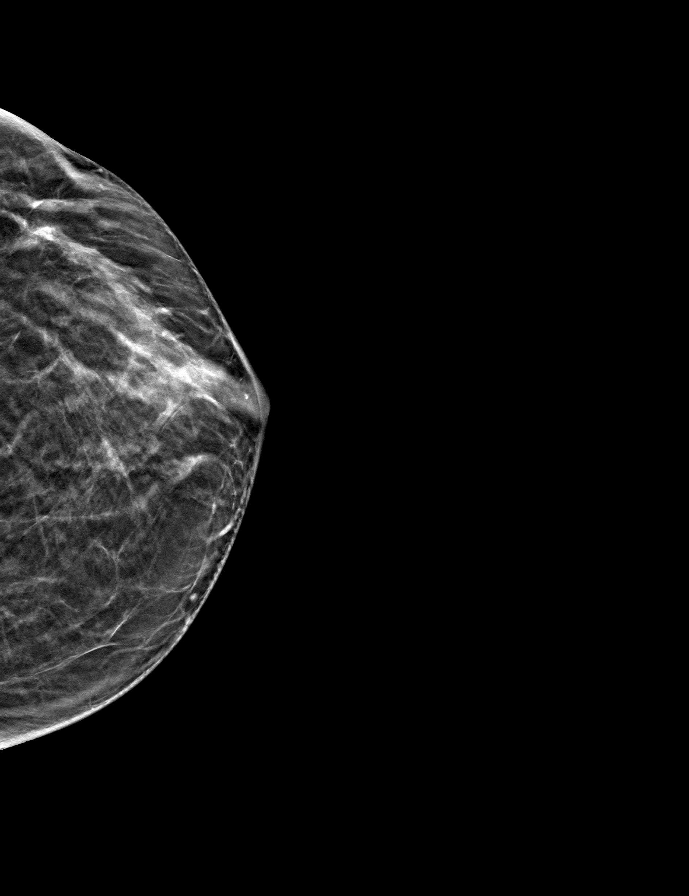

[L MLO tomo · tomo slice 24/47.0]
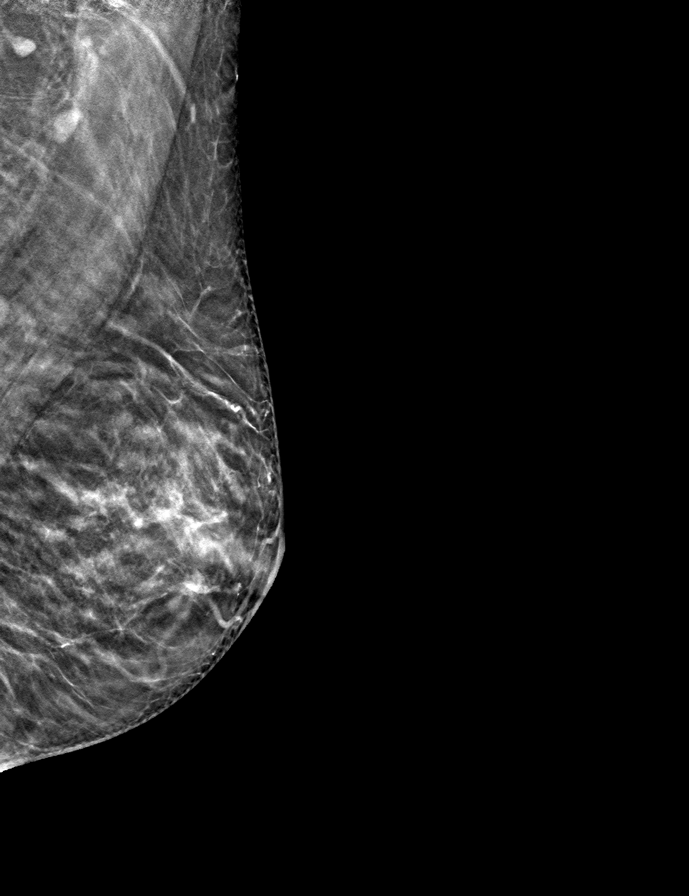

[R MLO tomo · tomo slice 23/45.0]
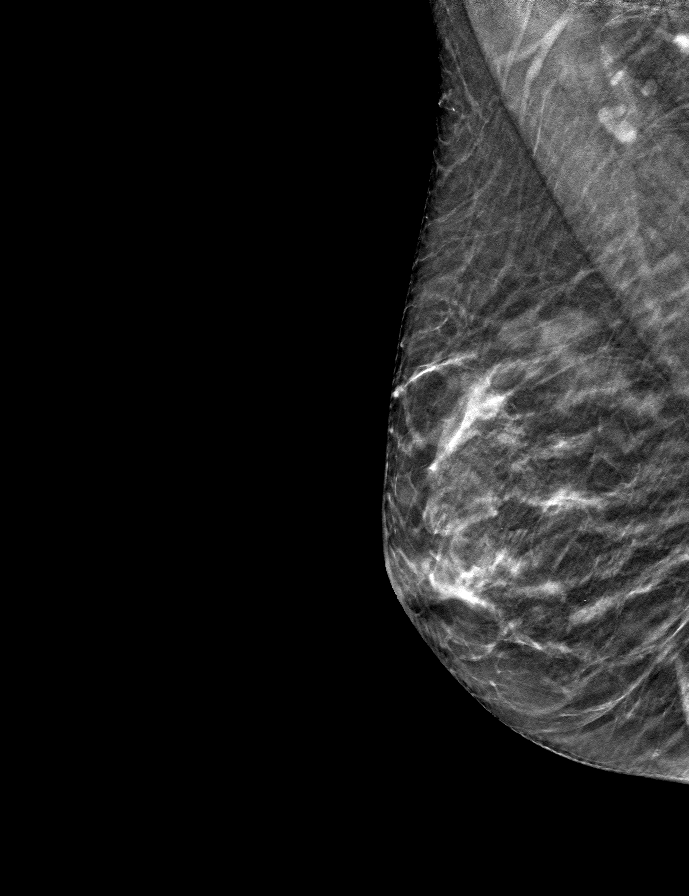

[R CC tomo · tomo slice 22/43.0]
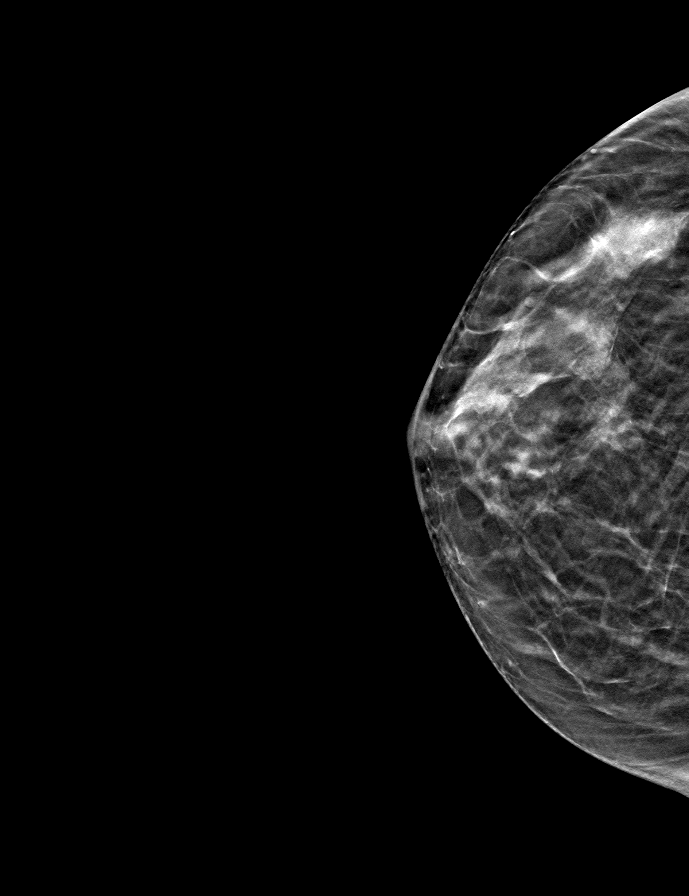

[8 of 24 positions shown; findings below may reference images not displayed]

ACR Breast Density Category c: The breast tissue is heterogeneously
dense, which may obscure small masses.
FINDINGS: No suspicious mass, distortion, or microcalcifications are
identified to suggest presence of malignancy.

Mammographic images were processed with CAD.

On physical exam, I palpate no abnormality in the UPPER INNER
QUADRANT of the LEFT breast.

Targeted ultrasound is performed, showing normal appearing
fibroglandular tissue throughout the UPPER INNER QUADRANT of the
LEFT breast.
IMPRESSION: No mammographic or ultrasound evidence for malignancy.

We discussed some of the issues regarding breast pain, including
limiting caffeine, wearing adequate support, over-the-counter pain
medication, low-fat diet, exercise, and ice as needed.

RECOMMENDATION:
Screening mammogram in one year.(Code:PA-[id number])

I have discussed the findings and recommendations with the patient.
Results were also provided in writing at the conclusion of the
visit. If applicable, a reminder letter will be sent to the patient
regarding the next appointment.

BI-RADS CATEGORY  1: Negative.

## 2018-07-13 NOTE — Telephone Encounter (Signed)
Received genetic results from [facility].   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 All heterozygous.   Results ready for Dr. Lucia GaskinsAhern to review and also sent to medical records for scanning.

## 2018-07-19 NOTE — Telephone Encounter (Signed)
Mailed genetic testing results to patient

## 2018-07-19 NOTE — Telephone Encounter (Signed)
Please call patient and let her know, she will have to call [facility] the genetic counselors to discuss. None of these seem to fit her symptoms. Unofrtunately as we discussed with genetic testing we can get abnormalities that really dn;t mean anything or are of "uncertain significance" and patients need to follow up with their genetic counselors. Mail her a copy thanks.  thanks

## 2018-07-19 NOTE — Telephone Encounter (Signed)
Spoke with patient and discussed that the genetic results showed variant of uncertain significance in certain genes. Advised pt that we can mail these results to her home if she would like. Pt agreed and would like for these to mailed to her. Confirmed pt's address. Discussed that pt would need to call [facility] to discuss with the genetic counselors. None of these seem to fit her symptoms. Patient verbalized understanding and appreciation. 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 She also stated that she is working with her endocrinologist currently to try to help her symptoms. She stated that if she does not improve, she would like to send Dr. Jaynee Eagles a mychart message about getting a referral as previously discussed. Pt had no further questions or concerns.

## 2022-03-10 ENCOUNTER — Ambulatory Visit (HOSPITAL_BASED_OUTPATIENT_CLINIC_OR_DEPARTMENT_OTHER): Admit: 2022-03-10 | Payer: No Typology Code available for payment source | Admitting: Obstetrics and Gynecology

## 2022-03-10 SURGERY — SALPINGO-OOPHORECTOMY, LAPAROSCOPIC
Anesthesia: Choice

## 2022-04-08 ENCOUNTER — Encounter: Payer: Self-pay | Admitting: Obstetrics and Gynecology

## 2022-04-08 ENCOUNTER — Ambulatory Visit (INDEPENDENT_AMBULATORY_CARE_PROVIDER_SITE_OTHER): Payer: No Typology Code available for payment source | Admitting: Obstetrics and Gynecology

## 2022-04-08 VITALS — BP 150/83 | HR 118 | Ht 61.75 in | Wt 116.0 lb

## 2022-04-08 DIAGNOSIS — N816 Rectocele: Secondary | ICD-10-CM

## 2022-04-08 DIAGNOSIS — R350 Frequency of micturition: Secondary | ICD-10-CM

## 2022-04-08 DIAGNOSIS — N812 Incomplete uterovaginal prolapse: Secondary | ICD-10-CM

## 2022-04-08 DIAGNOSIS — N8110 Cystocele, unspecified: Secondary | ICD-10-CM | POA: Diagnosis not present

## 2022-04-08 LAB — POCT URINALYSIS DIPSTICK
Bilirubin, UA: NEGATIVE
Blood, UA: NEGATIVE
Glucose, UA: NEGATIVE
Ketones, UA: NEGATIVE
Nitrite, UA: NEGATIVE
Protein, UA: NEGATIVE
Spec Grav, UA: 1.015 (ref 1.010–1.025)
Urobilinogen, UA: 0.2 E.U./dL
pH, UA: 7 (ref 5.0–8.0)

## 2022-04-08 NOTE — Patient Instructions (Signed)
You have a stage 2 (out of 4) prolapse.  We discussed the fact that it is not life threatening but there are several treatment options. For treatment of pelvic organ prolapse, we discussed options for management including expectant management, conservative management, and surgical management, such as Kegels, a pessary, pelvic floor physical therapy, and specific surgical procedures.    

## 2022-04-08 NOTE — Progress Notes (Signed)
[facility] New Patient Evaluation and Consultation  Referring Provider: Virgina Jock, NP PCP: Juluis Rainier, MD (Inactive) Date of Service: 04/08/2022  SUBJECTIVE Chief Complaint: New Patient (Initial Visit)  History of Present Illness: Katie Brewer is a 61 y.o. White or Caucasian female seen in consultation at the request of NP Virgina Jock for evaluation of prolapse.    Review of records significant for: Has a family history of ovarian cancer. Has had normal pelvic ultrasounds. Also has cystocele and interested in surgery.   Urinary Symptoms: Does not leak urine.   Day time voids 4-5.  Nocturia: 1 times per night to void. Voiding dysfunction: she empties her bladder well.  does not use a catheter to empty bladder.  When urinating, she feels she has no difficulties   UTIs:  0  UTI's in the last year.   Reports history of kidney or bladder stones  Pelvic Organ Prolapse Symptoms:                  She Admits to a feeling of a bulge the vaginal area. It has been present for 10 years.  She Admits to seeing a bulge.  This bulge is bothersome. Increases in size between a "cherry tomato" and a "roma tomato"   Bowel Symptom: Bowel movements: 1 time(s) per day Stool consistency: soft  Straining: no.  Splinting: no.  Incomplete evacuation: no.  She Denies accidental bowel leakage / fecal incontinence Bowel regimen: none  Sexual Function Sexually active: yes.  Sexual orientation:  heterosexual Pain with sex: No  Pelvic Pain Denies pelvic pain   Past Medical History:  Past Medical History:  Diagnosis Date   Hashimoto's thyroiditis    Hemorrhoids    History of chicken pox    History of cystocele    Hyperlipidemia    Hypothyroidism    Kidney stone 01/2018   MVP (mitral valve prolapse)    Rectocele    SVT (supraventricular tachycardia) (HCC)    Tachycardia    Uterine prolapse      Past Surgical History:   Past Surgical History:   Procedure Laterality Date   APPENDECTOMY  1976   BREAST BIOPSY Left 1996   CHOLECYSTECTOMY  2012   TONSILLECTOMY  1981     Past OB/GYN History: OB History  Gravida Para Term Preterm AB Living  3 2 2   1 2   SAB IAB Ectopic Multiple Live Births  1       2    # Outcome Date GA Lbr Len/2nd Weight Sex Delivery Anes PTL Lv  3 Term      Vag-Spont     2 Term      Vag-Spont     1 SAB            No forceps/ vacuum deliveries Menopausal: Yes Any history of abnormal pap smears: no.  Mother has passed away from ovarian cancer. Has had genetic testing. 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 She has been getting TVUS and CA 125 every 6 months.   Medications: She has a current medication list which includes the following prescription(s): cholecalciferol, digestive enzymes, estazolam, fludrocortisone, thyroid, and OVER THE COUNTER MEDICATION.   Allergies: Patient is allergic to meperidine, shellfish allergy, ampicillin, azithromycin, demerol, erythromycin, influenza vaccines, penicillins, sulfa antibiotics, sulfa drugs cross reactors, and thimerosal.   Social History:  Social History   Tobacco Use   Smoking status: Never   Smokeless tobacco: Never  Vaping Use   Vaping Use: Never used  Substance Use Topics   Alcohol use: No   Drug use: No    Relationship status: married She lives with husband.   She works Health visitor Regular exercise: No History of abuse: No  Family History:   Family History  Problem Relation Age of Onset   Ovarian cancer Mother 62   Skin cancer Mother        non-melanoma   Diabetes type II Father        and mother    Heart disease Father    High blood pressure Father    Skin cancer Father        non-melanoma   Pancreatic cancer Father    Renal cancer Sister        died of neuroblastoma at age 49-11y; diagnosed 1 year before   Other Daughter        gluten sensitivity   Diabetes type I Son     Breast cancer Maternal Aunt 46   Lung cancer Maternal Uncle        not a smoker; worked around tobacco   Heart disease Maternal Uncle        d. 31s   Stomach cancer Paternal Uncle 2   Testicular cancer Paternal Uncle        dx. 30s   Heart attack Maternal Grandfather        d. 74s   Kidney Stones Paternal Grandmother        had to have a kidney removed   Stroke Paternal Grandfather        d. 71   Heart attack Paternal Grandfather        smoker   Breast cancer Cousin        maternal 1st cousin dx breast cancer at 54-62; w/ mets   Colon cancer Neg Hx    Neuromuscular disorder Neg Hx      Review of Systems: ROS   OBJECTIVE Physical Exam: Vitals:   04/08/22 1510  BP: (!) 150/83  Pulse: (!) 118  Weight: 116 lb (52.6 kg)  Height: 5' 1.75" (1.568 m)    Physical Exam   GU / Detailed Urogynecologic Evaluation:  Pelvic Exam: Normal external female genitalia; Bartholin's and Skene's glands normal in appearance; urethral meatus normal in appearance, no urethral masses or discharge.   CST: negative  Speculum exam reveals normal vaginal mucosa with atrophy. Cervix normal appearance. Uterus normal single, nontender. Adnexa no mass, fullness, tenderness.     Pelvic floor strength I/V  Pelvic floor musculature: Right levator non-tender, Right obturator non-tender, Left levator non-tender, Left obturator non-tender  POP-Q:   POP-Q  1                                            Aa   1                                           Ba  -3                                              C  5.5                                            Gh  3                                            Pb  7                                            tvl   1                                            Ap  1                                            Bp  -5                                              D     Rectal Exam:  Normal external rectum  Post-Void Residual (PVR) by Bladder  Scan: In order to evaluate bladder emptying, we discussed obtaining a postvoid residual and she agreed to this procedure.  Procedure: The ultrasound unit was placed on the patient's abdomen in the suprapubic region after the patient had voided. A PVR of 13 ml was obtained by bladder scan.  Laboratory Results: POC urine: trace leukocytes   ASSESSMENT AND PLAN Ms. Shackett is a 61 y.o. with:  1. Uterovaginal prolapse, incomplete   2. Prolapse of anterior vaginal wall   3. Prolapse of posterior vaginal wall   4. Urinary frequency    Stage II anterior, Stage II posterior, Stage I apical prolapse - For treatment of pelvic organ prolapse, we discussed options for management including expectant management, conservative management, and surgical management, such as Kegels, a pessary, pelvic floor physical therapy, and specific surgical procedures. - She is interested in surgery. We discussed two options for prolapse repair and that these can be done with or without hysterectomy:  1) vaginal repair without mesh - Pros - safer, no mesh complications - Cons - not as strong as mesh repair, higher risk of recurrence  2) laparoscopic repair with mesh - Pros - stronger, better long-term success - Cons - risks of mesh implant (erosion into vagina or bladder, adhering to the rectum, pain) - these risks are lower than with a vaginal mesh but still exist - We reviewed that she does have uterine prolapse, and although short term outcomes support hysteropexy, cannot guarantee similar outcomes to hysterectomy long term. She prefers surgery without mesh: TVH with BSO due to family history of ovarian cancer. Also recommend uterosacral suspension, A&P repair and perineorrhaphy. Handout provided on this option.  - Discussed need for recovery of 6-8 weeks and possible need for a catheter. 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 Will have her return  in march to reexamine and make final plan for surgery.  - Also recommended urodynamic testing to assess for occult incontinence. Patient declines any type of bladder testing and is aware that she may have incontinence post-procedure. She would plan for an interval procedure if needed.   Return March 2024 for exam    Marguerita Beards, MD

## 2022-10-29 ENCOUNTER — Ambulatory Visit: Payer: No Typology Code available for payment source | Admitting: Obstetrics and Gynecology

## 2023-01-13 ENCOUNTER — Other Ambulatory Visit (HOSPITAL_BASED_OUTPATIENT_CLINIC_OR_DEPARTMENT_OTHER): Payer: Self-pay | Admitting: Obstetrics and Gynecology

## 2023-01-13 DIAGNOSIS — Z8249 Family history of ischemic heart disease and other diseases of the circulatory system: Secondary | ICD-10-CM

## 2023-01-28 ENCOUNTER — Ambulatory Visit (HOSPITAL_BASED_OUTPATIENT_CLINIC_OR_DEPARTMENT_OTHER)
Admission: RE | Admit: 2023-01-28 | Discharge: 2023-01-28 | Disposition: A | Discharge status: Home / Self Care | Payer: No Typology Code available for payment source | Source: Ambulatory Visit | Attending: Obstetrics and Gynecology | Admitting: Obstetrics and Gynecology

## 2023-01-28 DIAGNOSIS — Z8249 Family history of ischemic heart disease and other diseases of the circulatory system: Secondary | ICD-10-CM

## 2023-02-10 ENCOUNTER — Other Ambulatory Visit: Payer: Self-pay | Admitting: Obstetrics and Gynecology

## 2023-02-10 DIAGNOSIS — Z800 Family history of malignant neoplasm of digestive organs: Secondary | ICD-10-CM

## 2023-02-12 LAB — COLOGUARD: COLOGUARD: NEGATIVE

## 2023-04-15 ENCOUNTER — Encounter (HOSPITAL_BASED_OUTPATIENT_CLINIC_OR_DEPARTMENT_OTHER): Payer: Self-pay

## 2023-04-27 ENCOUNTER — Encounter (HOSPITAL_BASED_OUTPATIENT_CLINIC_OR_DEPARTMENT_OTHER): Payer: Self-pay | Admitting: Cardiology

## 2023-04-27 ENCOUNTER — Ambulatory Visit (HOSPITAL_BASED_OUTPATIENT_CLINIC_OR_DEPARTMENT_OTHER): Payer: No Typology Code available for payment source | Admitting: Cardiology

## 2023-04-27 VITALS — BP 148/83 | HR 102 | Ht 62.0 in | Wt 114.8 lb

## 2023-04-27 DIAGNOSIS — I2510 Atherosclerotic heart disease of native coronary artery without angina pectoris: Secondary | ICD-10-CM | POA: Diagnosis not present

## 2023-04-27 DIAGNOSIS — I700 Atherosclerosis of aorta: Secondary | ICD-10-CM | POA: Diagnosis not present

## 2023-04-27 DIAGNOSIS — Z712 Person consulting for explanation of examination or test findings: Secondary | ICD-10-CM | POA: Diagnosis not present

## 2023-04-27 DIAGNOSIS — I341 Nonrheumatic mitral (valve) prolapse: Secondary | ICD-10-CM

## 2023-04-27 DIAGNOSIS — R000 Tachycardia, unspecified: Secondary | ICD-10-CM | POA: Diagnosis not present

## 2023-04-27 NOTE — Patient Instructions (Addendum)
Medication Instructions:  Your physician recommends that you continue on your current medications as directed. Please refer to the Current Medication list given to you today.  Follow-Up: At [facility], you and your health needs are our priority.  As part of our continuing mission to provide you with exceptional heart care, we have created designated Provider Care Teams.  These Care Teams include your primary Cardiologist (physician) and Advanced Practice Providers (APPs -  Physician Assistants and Nurse Practitioners) who all work together to provide you with the care you need, when you need it.  We recommend signing up for the patient portal called "MyChart".  Sign up information is provided on this After Visit Summary.  MyChart is used to connect with patients for Virtual Visits (Telemedicine).  Patients are able to view lab/test results, encounter notes, upcoming appointments, etc.  Non-urgent messages can be sent to your provider as well.   To learn more about what you can do with MyChart, go to [email or web address].    Your next appointment:   1 year with Dr. Cristal Deer   Other Instructions -avoid dehydration. Often it requires high volumes of fluids, often with salt/electrolytes included, to stay hydrated. People with orthostasis are very sensitive to fluid shifts and dehydration. Oral rehydration is preferred, and routine use of IV fluids is not recommended. -if tolerated, compression stocking can assist with fluid management and prevent pooling in the legs. -slow position changes are recommended -if there is a feeling of severe lightheadedness, like near to passing out, recommend lying on the floor on the back, with legs elevated up on a chair or up against the wall. -the best long term management is gradual exercise conditioning. I recommend seated exercises such as bike to start, to avoid the risk of falling with lightheadedness. Exercise programs, either through supervised  programs like cardiac rehab or through personal programs, should focus on gradually increasing exercise tolerance and conditioning.   ^^^^^^^^^^^^^^^^^^^^^^^^^^^^^^^^^^^^^^^^^^^^^^^^^^^^^^^^^^^^^^^^^^^^^^^^^^^^^^^^^^^^^^^^^^^^^^^^^^^^^^^^^^^^^^^^^^^ Recent data, summarized from NPR from a study from the [facility]:  CropWizard.com.pt  "Researchers at the [facility] set out to answer this question by comparing statins to supplements in a clinical trial. They tracked the outcomes of 190 adults, ages 61 to 52. Some participants were given a 5 mg daily dose of rosuvastatin, a statin that is sold under the brand name Crestor for 28 days. Others were given supplements, including fish oil, cinnamon, garlic, turmeric, plant sterols or red yeast rice for the same period."  "What we found was that rosuvastatin lowered LDL cholesterol by almost 30% and that was vastly superior to placebo and any of the six supplements studied in the trial," study Kayren Eaves, M.D. of the [facility] told NPR. He says this level of reduction is enough to lower the risk of heart attacks and strokes. The findings are published in the Journal of the [employer] of Cardiology.  "Oftentimes these supplements are marketed as 'natural ways' to lower your cholesterol," says Laffin. But he says none of the dietary supplements demonstrated any significant decrease in LDL cholesterol compared with a placebo. LDL cholesterol is considered the 'bad cholesterol' because it can contribute to plaque build-up in the artery walls - which can narrow the arteries, and set the stage for heart attacks and strokes"

## 2023-04-27 NOTE — Progress Notes (Signed)
Cardiology Office Note:  .    Date:  04/27/2023  ID:  Katie Brewer, DOB December 02, 1960, MRN [id number] PCP: Juluis Rainier, MD (Inactive)  [facility] Providers Cardiologist:  Jodelle Red, MD     History of Present Illness: .    Katie Brewer is a 62 y.o. female with a hx of CAD, aortic atherosclerosis, mitral valve prolapse, syncope, Hashimoto's thyroiditis, anorexia, here for the evaluation of coronary artery disease.   Referral notes from Dr. Arelia Sneddon personally reviewed. They pursued coronary CT 01/2023 which revealed a coronary calcium score of 51 and evidence of aortic atherosclerosis.  Cardiovascular risk factors: Prior clinical ASCVD:  She had a coronary CT 01/2023 which revealed a coronary calcium score of 51 and evidence of aortic atherosclerosis. Comorbid conditions:  Her main concern is her ongoing issues with sinus tachycardia. This has been bothersome with elevated heart rates such as 125 bpm while walking or when sitting down. Resting heart rates lately have averaged 88-90 bpm, previously 85 bpm. Her heart rate also increases when she stands. Generally she feels fine while she is moving, but while standing still her heart rate remains elevated and she may break out in a sweat, chills, flushed feelings. She hears her heart beating in her ears as well. Currently she is being evaluated by her ENT for pulsatile tinnitus. Additionally she continues to have some skipped beats/palpitations, although these have improved overall since she was last evaluated by Dr. Graciela Husbands. At times she would describe her palpitations as feeling like she "quivers like an old car". In the office today her blood pressure is 148/83. She reports that her readings are typically lower at home. She confirms having orthostatic episodes with her blood pressure decreasing upon standing. Has tried compression socks in the Winter, without much noticeable improvement. She affirms a history of presyncope  or "greying out" episodes. These episodes have essentially resolved at this time on her current thyroid regimen. Now on Tirosint-SOL which has helped. Metabolic syndrome/Obesity:  Current weight 114 lbs. Chronic inflammatory conditions: Hashimoto's thyroiditis Tobacco use history: Never Family history: Prior pertinent testing and/or incidental findings: Mild mitral valve prolapse seen on echo 06/2017. Also wore a monitor in 01/2017 with rare PACs. ETT was normal with no evidence for ischemia 01/2017. Previously seen by Dr. Graciela Husbands for palpitations in 2018 and 2019, as well as [facility].  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 When she is walking, her peak heart rate ranges from 120s-140s per her Fit Bit, highest 150 bpm during very hot weather. Often feels better after she walks. Additionally she routinely exercises with some weight training. Current diet: Typically she cooks her own meals and considers her diet to be generally healthy. She will drink a bottle of water (2.5 cups) with lemon prior to breakfast, coffee with breakfast, another bottle of water at lunch. Approximately 7-8 cups of liquid a day.  She denies any chest pain, shortness of breath, headaches, orthopnea, or PND.  ROS:  Please see the history of present illness. ROS otherwise negative except as noted.  (+) Palpitations (+) Intermittent ankle swelling  Studies Reviewed: Marland Kitchen    EKG Interpretation Date/Time:  Monday April 27 2023 13:35:06 EDT Ventricular Rate:  102 PR Interval:  138 QRS Duration:  58 QT Interval:  326 QTC Calculation: 424 R Axis:   57  Text Interpretation: Sinus tachycardia When compared with ECG of 26-Sep-2012 14:33, No significant change was found Confirmed by Jodelle Red (  [id number]) on 04/27/2023 1:48:15 PM    Bilateral Carotid Doppler  03/24/2023  ([facility]): IMPRESSION:  1. No hemodynamically significant stenosis on either side.   2.  Both  vertebral arteries are patent with antegrade flow.   CT Cardiac Scoring  01/28/2023: IMPRESSION: Coronary calcium score of 51. This was 76th percentile for age-, race-, and sex-matched controls. Aortic atherosclerosis noted.  Echo  07/06/2017: Study Conclusions  - Left ventricle: The cavity size was normal. Wall thickness was    normal. Systolic function was normal. The estimated ejection    fraction was in the range of 60% to 65%. Wall motion was normal;    there were no regional wall motion abnormalities. Doppler    parameters are consistent with abnormal left ventricular    relaxation (grade 1 diastolic dysfunction).  - Aortic valve: There was trivial regurgitation.  - Mitral valve: Mild prolapse, involving the anterior leaflet.    There was mild regurgitation.   Physical Exam:    VS:  BP (!) 148/83 (BP Location: Right Arm, Patient Position: Sitting, Cuff Size: Normal)   Pulse (!) 102   Ht 5\' 2"  (1.575 m)   Wt 114 lb 12.8 oz (52.1 kg)   LMP 06/06/2009   BMI 21.00 kg/m     Orthostatic VS for the past 24 hrs (Last 3 readings):  BP- Lying Pulse- Lying BP- Sitting Pulse- Sitting BP- Standing at 0 minutes Pulse- Standing at 0 minutes BP- Standing at 3 minutes Pulse- Standing at 3 minutes  04/27/23 1348 (!) 147/94 111 152/86 114 151/86 121 151/90 123     Wt Readings from Last 3 Encounters:  04/27/23 114 lb 12.8 oz (52.1 kg)  04/08/22 116 lb (52.6 kg)  05/05/18 97 lb 6.4 oz (44.2 kg)    GEN: Well nourished, well developed in no acute distress HEENT: Normal, moist mucous membranes NECK: No JVD CARDIAC: regular rhythm, normal S1 and S2, no rubs or gallops. No murmur. VASCULAR: Radial and DP pulses 2+ bilaterally. No carotid bruits RESPIRATORY:  Clear to auscultation without rales, wheezing or rhonchi  ABDOMEN: Soft, non-tender, non-distended MUSCULOSKELETAL:  Ambulates independently SKIN: Warm and dry, no edema NEUROLOGIC:  Alert and oriented x 3. No focal neuro deficits  noted. PSYCHIATRIC:  Normal affect   ASSESSMENT AND PLAN: .    Sinus tachycardia -normal orthostatics today -baseline sinus tach -has had evaluation with Dr. Graciela Husbands in the past -reviewed recommendations for increasing hydration, compression stockings, gradual increase in exercise conditioning -will update echo  Mitral valve prolapse: mild, asymptomatic  Coronary calcification on CT, consistent with nonobstructive CAD Aortic atherosclerosis -ca score 51 in 2024 -discussed pathology of cholesterol, how plaques form, that MI/CVA result commonly from acute plaque rupture and not gradual stenosis. Discussed mechanism of statin to both decrease plaque accumulation and stabilize plaque that is already present. Discussed that calcium is a marker for plaque, with decades of validated data regarding average amounts of calcium for age/gender/ethnicity, as well as value of calcium score for risk stratification  -we discussed the data on statins, both in terms of their long term benefit as well as the risk of side effects. Reviewed common misconceptions about statins. Reviewed how we monitor treatment. After shared decision making, she will consider statin therapy and let us know if she wishes to pursue.  Dispo: Follow-up in 1 year, or sooner as needed.  I,Mathew Stumpf,acting as a Neurosurgeon for [employer], MD.,have documented all relevant documentation on the behalf of Jodelle Red, MD,as directed by  Jodelle Red, MD while in the presence of Jodelle Red, MD.  I, Jodelle Red, MD, have reviewed all documentation for this visit. The documentation on 04/27/23 for the exam, diagnosis, procedures, and orders are all accurate and complete.   Signed, Jodelle Red, MD

## 2023-05-05 ENCOUNTER — Encounter (HOSPITAL_BASED_OUTPATIENT_CLINIC_OR_DEPARTMENT_OTHER): Payer: Self-pay

## 2023-05-05 DIAGNOSIS — I341 Nonrheumatic mitral (valve) prolapse: Secondary | ICD-10-CM

## 2023-05-05 DIAGNOSIS — H93A9 Pulsatile tinnitus, unspecified ear: Secondary | ICD-10-CM

## 2023-05-05 NOTE — Telephone Encounter (Signed)
Recommend echocardiogram for further evaluation of murmur.   Alver Sorrow, NP

## 2023-05-13 ENCOUNTER — Encounter: Payer: Self-pay | Admitting: Obstetrics and Gynecology

## 2023-05-13 DIAGNOSIS — Z800 Family history of malignant neoplasm of digestive organs: Secondary | ICD-10-CM

## 2023-05-27 ENCOUNTER — Ambulatory Visit (HOSPITAL_BASED_OUTPATIENT_CLINIC_OR_DEPARTMENT_OTHER): Payer: No Typology Code available for payment source

## 2023-05-27 DIAGNOSIS — H93A9 Pulsatile tinnitus, unspecified ear: Secondary | ICD-10-CM

## 2023-05-27 DIAGNOSIS — I341 Nonrheumatic mitral (valve) prolapse: Secondary | ICD-10-CM | POA: Diagnosis not present

## 2023-05-27 LAB — ECHOCARDIOGRAM COMPLETE
Area-P 1/2: 5.02 cm2
S' Lateral: 2.37 cm

## 2023-06-29 ENCOUNTER — Encounter (HOSPITAL_BASED_OUTPATIENT_CLINIC_OR_DEPARTMENT_OTHER): Payer: Self-pay

## 2023-07-02 NOTE — H&P (Signed)
Patient name  Keeanna, Freise DICTATION# [id number] YNW#[id number]  Richardean Chimera, MD 07/02/2023 6:11 AM

## 2023-07-02 NOTE — H&P (Signed)
NAMELORANN, ZENDER MEDICAL RECORD NO: [id number] ACCOUNT NO: [id number] DATE OF BIRTH: Oct 17, 1960 PHYSICIAN: Juluis Mire, MD  History and Physical   DATE OF ADMISSION: 07/14/2023  Date of her surgery is 07/14/2023 at [facility] outpatient area.  HISTORY OF PRESENT ILLNESS:  In relation to the present admission, the patient is a 62 year old gravida 3, para 2 postmenopausal patient that presents for laparoscopically assisted vaginal hysterectomy with anterior and posterior repair along with a  sacrospinous ligament suspension.   In relation to the present admission, the patient has been troubled with worsening pelvic relaxation. She had seen a urogynecologist for evaluation. They recommended surgical intervention. She now presents at the present time.  ALLERGIES:  SHE IS ALLERGIC TO AMPICILLIN, DEMEROL, ERYTHROMYCIN, SULFA, AND THIMEROSAL.  MEDICATIONS:  At the present time, she is on estazolam 1 mg tablet. She is on Tirosint-Sol 100 mcg/mL orally.  PAST MEDICAL HISTORY: She has had usual childhood diseases without significant sequelae. She is being managed for hypothyroidism.  PAST SURGICAL HISTORY: She has had an appendectomy, breast biopsy, cholecystectomy, and tonsillectomy.  SOCIAL HISTORY:  Reveals no tobacco or alcohol abuse.  FAMILY HISTORY: Noncontributory except for the fact that her mother had ovarian cancer.  Father had pancreatic cancer.  The patient did do genetic screening that came back negative.  REVIEW OF SYSTEMS:  Noncontributory.  PHYSICAL EXAMINATION: VITAL SIGNS:  The patient is afebrile with stable vital signs. HEENT:  The patient is normocephalic.  Pupils equal, round, and reactive to light and accommodation.  Extraocular movements are intact.  Sclerae and conjunctivae are clear.  NECK:  Thyroid nonpalpable. BREASTS:  Not examined. CARDIOVASCULAR SYSTEM EXAM:  Regular rhythm and rate without murmurs or gallops. ABDOMINAL EXAM:  Benign. No masses,  organomegaly, or tenderness. 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 Bimanual exam was unremarkable. Uterus normal size and shape. There was no adnexal masses. EXTREMITIES: Trace edema. NEUROLOGIC EXAM:  Grossly within normal limits.  ASSESSMENT: 1.  severe pelvic relaxation with associated symptomatology. 2.  Hashimoto's thyroiditis.  PLAN: The patient will undergo laparoscopic-assisted vaginal hysterectomy with anterior and posterior repair and sacrospinous ligament suspension. Risks of surgery have been discussed including the risk of infection, risk of hemorrhage that could require  transfusion with the risk of AIDS or hepatitis, risk of injury to adjacent organs including bladder, bowel, or ureters that could require further exploratory surgery. Risk of deep venous thrombosis and pulmonary embolus. Possibility of recurrent pelvic  relaxation in the future has also been explained. The patient expressed understanding of the indications and risks.    PAA D: 07/02/2023 6:10:51 am T: 07/02/2023 6:53:00 am  JOB: [id number]/ [id number]

## 2023-07-07 ENCOUNTER — Encounter (HOSPITAL_BASED_OUTPATIENT_CLINIC_OR_DEPARTMENT_OTHER): Payer: Self-pay | Admitting: Obstetrics and Gynecology

## 2023-07-07 ENCOUNTER — Other Ambulatory Visit: Payer: Self-pay

## 2023-07-07 DIAGNOSIS — N8189 Other female genital prolapse: Secondary | ICD-10-CM | POA: Diagnosis not present

## 2023-07-07 DIAGNOSIS — Z01812 Encounter for preprocedural laboratory examination: Secondary | ICD-10-CM | POA: Diagnosis present

## 2023-07-07 NOTE — Progress Notes (Addendum)
Spoke w/ via phone for pre-op interview---Katie Brewer needs dos----none         Brewer results------07/13/23 Brewer appt for cbc, bmp, type & screen, hiv, 04/27/23 EKG in chart & Epic COVID test -----patient states asymptomatic no test needed Arrive at -------0530 on Tuesday, 07/14/2023 NPO after MN NO Solid Food.  Clear liquids from MN until---0430 Med rec completed Medications to take morning of surgery -----none Diabetic medication -----n/a Patient instructed no nail polish to be worn day of surgery Patient instructed to bring photo id and insurance card day of surgery Patient aware to have Driver (ride ) / caregiver    for 24 hours after surgery - husband, Katie Noa "Chip" Patient Special Instructions -----Extended / overnight stay instructions given.  Pre-Op special Instructions -----Patient is bringing an eye ointment and eye drops. She states that due to to recurrent corneal erosions, she needs to put the ointment in her eye before surgery and the drops immediately after surgery. Patient verbalized understanding of instructions that were given at this phone interview. Patient denies chest pain, sob, fever, cough at the interview.   Patient has a hx of CAD, aortic atherosclerosis, mitral valve prolapse, syncope and sinus tachycardia. She follows w/ Jodelle Red, MD, cardiology. LOV w. Cardiology, 04/27/23. 01/2023 CT coronary - coronary calcium score of 51 and evidence of aortic atheroschlerosis 05/2023 Echocardiogram, EF 60 - 65% 04/27/23 EKG in chart & Epic 2019 normal Exercise Tolerance Test

## 2023-07-07 NOTE — Progress Notes (Signed)
Your procedure is scheduled on Tuesday, 07/14/2023.  Report to [facility] AT  5:30 AM.   Call this number if you have problems the morning of surgery  :[phone].   OUR ADDRESS IS [address].  WE ARE LOCATED IN [facility].  PLEASE BRING YOUR INSURANCE CARD AND PHOTO ID DAY OF SURGERY.                                     REMEMBER:  DO NOT EAT FOOD, CANDY GUM OR MINTS  AFTER MIDNIGHT THE NIGHT BEFORE YOUR SURGERY . YOU MAY HAVE CLEAR LIQUIDS FROM MIDNIGHT THE NIGHT BEFORE YOUR SURGERY UNTIL  4:30 AM. NO CLEAR LIQUIDS AFTER   4:30 AM DAY OF SURGERY.  YOU MAY  BRUSH YOUR TEETH MORNING OF SURGERY AND RINSE YOUR MOUTH OUT, NO CHEWING GUM CANDY OR MINTS.     CLEAR LIQUID DIET    Allowed      Water                                                                   Coffee and tea, regular and decaf  (NO cream or milk products of any type, may sweeten)                         Carbonated beverages, regular and diet                                    Sports drinks like Gatorade _____________________________________________________________________     TAKE ONLY THESE MEDICATIONS MORNING OF SURGERY: NONE                                        DO NOT WEAR JEWERLY/  METAL/  PIERCINGS (INCLUDING NO PLASTIC PIERCINGS) DO NOT WEAR LOTIONS, POWDERS, PERFUMES OR NAIL POLISH ON YOUR FINGERNAILS. TOENAIL POLISH IS OK TO WEAR. DO NOT SHAVE FOR 48 HOURS PRIOR TO DAY OF SURGERY.  CONTACTS, GLASSES, OR DENTURES MAY NOT BE WORN TO SURGERY.  REMEMBER: NO SMOKING, VAPING ,  DRUGS OR ALCOHOL FOR 24 HOURS BEFORE YOUR SURGERY.                                    [facility] IS NOT RESPONSIBLE  FOR ANY BELONGINGS.                                                                    Marland Kitchen           [facility] - Preparing for Surgery Before surgery, you can play an important role.  Because skin is not sterile,  your skin needs to be as free of germs as possible.   You can reduce the number of germs on your skin by washing with CHG (chlorahexidine gluconate) soap before surgery.  CHG is an antiseptic cleaner which kills germs and bonds with the skin to continue killing germs even after washing. Please DO NOT use if you have an allergy to CHG or antibacterial soaps.  If your skin becomes reddened/irritated stop using the CHG and inform your nurse when you arrive at Short Stay. Do not shave (including legs and underarms) for at least 48 hours prior to the first CHG shower.  You may shave your face/neck. Please follow these instructions carefully:  1.  Shower with CHG Soap the night before surgery and the  morning of Surgery.  2.  If you choose to wash your hair, wash your hair first as usual with your  normal  shampoo.  3.  After you shampoo, rinse your hair and body thoroughly to remove the  shampoo.                                        4.  Use CHG as you would any other liquid soap.  You can apply chg directly  to the skin and wash , chg soap provided, night before and morning of your surgery.  5.  Apply the CHG Soap to your body ONLY FROM THE NECK DOWN.   Do not use on face/ open                           Wound or open sores. Avoid contact with eyes, ears mouth and genitals (private parts).                       Wash face,  Genitals (private parts) with your normal soap.             6.  Wash thoroughly, paying special attention to the area where your surgery  will be performed.  7.  Thoroughly rinse your body with warm water from the neck down.  8.  DO NOT shower/wash with your normal soap after using and rinsing off  the CHG Soap.             9.  Pat yourself dry with a clean towel.            10.  Wear clean pajamas.            11.  Place clean sheets on your bed the night of your first shower and do not  sleep with pets. Day of Surgery : Do not apply any lotions/ powders the morning of surgery.  Please wear clean clothes to the hospital/surgery  center.  IF YOU HAVE ANY SKIN IRRITATION OR PROBLEMS WITH THE SURGICAL SOAP, PLEASE GET A BAR OF GOLD DIAL SOAP AND SHOWER THE NIGHT BEFORE YOUR SURGERY AND THE MORNING OF YOUR SURGERY. PLEASE LET THE NURSE KNOW MORNING OF YOUR SURGERY IF YOU HAD ANY PROBLEMS WITH THE SURGICAL SOAP.   YOUR SURGEON MAY HAVE REQUESTED EXTENDED RECOVERY TIME AFTER YOUR SURGERY. IT COULD BE A  JUST A FEW HOURS  UP TO AN OVERNIGHT STAY.  YOUR SURGEON SHOULD HAVE DISCUSSED THIS WITH YOU PRIOR TO YOUR SURGERY. IN THE EVENT YOU NEED TO STAY OVERNIGHT PLEASE REFER TO THE  FOLLOWING GUIDELINES. YOU MAY HAVE UP TO 4 VISITORS  MAY VISIT IN THE EXTENDED RECOVERY ROOM UNTIL 800 PM ONLY.  ONE  VISITOR AGE 100 AND OVER MAY SPEND THE NIGHT AND MUST BE IN EXTENDED RECOVERY ROOM NO LATER THAN 800 PM . YOUR DISCHARGE TIME AFTER YOU SPEND THE NIGHT IS 900 AM THE MORNING AFTER YOUR SURGERY. YOU MAY PACK A SMALL OVERNIGHT BAG WITH TOILETRIES FOR YOUR OVERNIGHT STAY IF YOU WISH.  REGARDLESS OF IF YOU STAY OVER NIGHT OR ARE DISCHARGED THE SAME DAY YOU WILL BE REQUIRED TO HAVE A RESPONSIBLE ADULT (18 YRS OLD OR OLDER) STAY WITH YOU FOR AT LEAST THE FIRST 24 HOURS  YOUR PRESCRIPTION MEDICATIONS WILL BE PROVIDED DURING YOUR HOSPITAL STAY.  ________________________________________________________________________                                                        QUESTIONS Mechele Claude PRE OP NURSE PHONE [phone].

## 2023-07-13 ENCOUNTER — Encounter (HOSPITAL_COMMUNITY)
Admission: RE | Admit: 2023-07-13 | Discharge: 2023-07-13 | Disposition: A | Discharge status: Home / Self Care | Payer: No Typology Code available for payment source | Source: Ambulatory Visit | Attending: Obstetrics and Gynecology

## 2023-07-13 DIAGNOSIS — Z01812 Encounter for preprocedural laboratory examination: Secondary | ICD-10-CM | POA: Insufficient documentation

## 2023-07-13 DIAGNOSIS — N8189 Other female genital prolapse: Secondary | ICD-10-CM | POA: Insufficient documentation

## 2023-07-13 LAB — HIV ANTIBODY (ROUTINE TESTING W REFLEX): HIV Screen 4th Generation wRfx: NONREACTIVE

## 2023-07-13 LAB — BASIC METABOLIC PANEL
Anion gap: 7 (ref 5–15)
BUN: 10 mg/dL (ref 8–23)
CO2: 27 mmol/L (ref 22–32)
Calcium: 9.7 mg/dL (ref 8.9–10.3)
Chloride: 103 mmol/L (ref 98–111)
Creatinine, Ser: 0.55 mg/dL (ref 0.44–1.00)
GFR, Estimated: >60 mL/min (ref >60)
Glucose, Bld: 94 mg/dL (ref 70–99)
Potassium: 4.3 mmol/L (ref 3.5–5.1)
Sodium: 137 mmol/L (ref 135–145)

## 2023-07-13 LAB — CBC
HCT: 46.6 % — ABNORMAL HIGH (ref 36.0–46.0)
Hemoglobin: 15 g/dL (ref 12.0–15.0)
MCH: 29.4 pg (ref 26.0–34.0)
MCHC: 32.2 g/dL (ref 30.0–36.0)
MCV: 91.4 fL (ref 80.0–100.0)
Platelets: 182 10*3/uL (ref 150–400)
RBC: 5.1 MIL/uL (ref 3.87–5.11)
RDW: 12 % (ref 11.5–15.5)
WBC: 5.3 10*3/uL (ref 4.0–10.5)
nRBC: 0 % (ref 0.0–0.2)

## 2023-07-13 NOTE — Anesthesia Preprocedure Evaluation (Signed)
Anesthesia Evaluation  Patient identified by MRN, date of birth, ID band Patient awake    Reviewed: Allergy & Precautions, H&P , NPO status , Patient's Chart, lab work & pertinent test results  Airway Mallampati: III  TM Distance: <3 FB Neck ROM: Full   Comment: LOOKS ANTERIOR Dental no notable dental hx. (+) Teeth Intact, Dental Advisory Given, Loose,    Pulmonary neg pulmonary ROS   Pulmonary exam normal breath sounds clear to auscultation       Cardiovascular Exercise Tolerance: Good negative cardio ROS Normal cardiovascular exam Rhythm:Regular Rate:Normal     Neuro/Psych negative neurological ROS  negative psych ROS   GI/Hepatic negative GI ROS, Neg liver ROS,,,  Endo/Other  negative endocrine ROSHypothyroidism    Renal/GU negative Renal ROS  negative genitourinary   Musculoskeletal negative musculoskeletal ROS (+) Arthritis ,    Abdominal   Peds negative pediatric ROS (+)  Hematology negative hematology ROS (+)   Anesthesia Other Findings   Reproductive/Obstetrics negative OB ROS                             Anesthesia Physical Anesthesia Plan  ASA: 3  Anesthesia Plan: General   Post-op Pain Management: Tylenol PO (pre-op)* and Celebrex PO (pre-op)*   Induction: Intravenous  PONV Risk Score and Plan: 3 and Ondansetron, Dexamethasone, Treatment may vary due to age or medical condition and Midazolam  Airway Management Planned: Oral ETT and Video Laryngoscope Planned  Additional Equipment: None  Intra-op Plan:   Post-operative Plan: Extubation in OR  Informed Consent: I have reviewed the patients History and Physical, chart, labs and discussed the procedure including the risks, benefits and alternatives for the proposed anesthesia with the patient or authorized representative who has indicated his/her understanding and acceptance.       Plan Discussed with:  Anesthesiologist and CRNA  Anesthesia Plan Comments: (Needs eye drops both eyes prior to induction   )        Anesthesia Quick Evaluation

## 2023-07-14 ENCOUNTER — Encounter (HOSPITAL_BASED_OUTPATIENT_CLINIC_OR_DEPARTMENT_OTHER)
Admission: RE | Disposition: A | Discharge status: Home / Self Care | Payer: Self-pay | Source: Home / Self Care | Attending: Obstetrics and Gynecology

## 2023-07-14 ENCOUNTER — Ambulatory Visit (HOSPITAL_BASED_OUTPATIENT_CLINIC_OR_DEPARTMENT_OTHER): Payer: Self-pay | Admitting: Anesthesiology

## 2023-07-14 ENCOUNTER — Ambulatory Visit (HOSPITAL_BASED_OUTPATIENT_CLINIC_OR_DEPARTMENT_OTHER): Payer: No Typology Code available for payment source | Admitting: Anesthesiology

## 2023-07-14 ENCOUNTER — Other Ambulatory Visit: Payer: Self-pay

## 2023-07-14 ENCOUNTER — Observation Stay (HOSPITAL_BASED_OUTPATIENT_CLINIC_OR_DEPARTMENT_OTHER)
Admission: RE | Admit: 2023-07-14 | Discharge: 2023-07-15 | Disposition: A | Discharge status: Home / Self Care | Payer: No Typology Code available for payment source | Attending: Obstetrics and Gynecology | Admitting: Obstetrics and Gynecology

## 2023-07-14 ENCOUNTER — Encounter (HOSPITAL_BASED_OUTPATIENT_CLINIC_OR_DEPARTMENT_OTHER): Payer: Self-pay | Admitting: Obstetrics and Gynecology

## 2023-07-14 DIAGNOSIS — N8110 Cystocele, unspecified: Principal | ICD-10-CM | POA: Insufficient documentation

## 2023-07-14 DIAGNOSIS — N816 Rectocele: Secondary | ICD-10-CM | POA: Diagnosis not present

## 2023-07-14 DIAGNOSIS — N8189 Other female genital prolapse: Principal | ICD-10-CM | POA: Insufficient documentation

## 2023-07-14 DIAGNOSIS — Z01818 Encounter for other preprocedural examination: Secondary | ICD-10-CM

## 2023-07-14 HISTORY — DX: Puckering of macula, right eye: H35.371

## 2023-07-14 HISTORY — PX: LAPAROSCOPIC VAGINAL HYSTERECTOMY WITH SALPINGO OOPHORECTOMY: SHX6681

## 2023-07-14 HISTORY — DX: Presence of spectacles and contact lenses: Z97.3

## 2023-07-14 HISTORY — DX: Recurrent erosion of cornea, unspecified eye: H18.839

## 2023-07-14 HISTORY — DX: Atherosclerosis of aorta: I70.0

## 2023-07-14 HISTORY — PX: CYSTOSCOPY: SHX5120

## 2023-07-14 HISTORY — DX: Personal history of urinary calculi: Z87.442

## 2023-07-14 HISTORY — DX: Pulsatile tinnitus, unspecified ear: H93.A9

## 2023-07-14 HISTORY — PX: ANTERIOR AND POSTERIOR REPAIR WITH SACROSPINOUS FIXATION: SHX6536

## 2023-07-14 LAB — TYPE AND SCREEN
ABO/RH(D): O POS
Antibody Screen: NEGATIVE

## 2023-07-14 LAB — ABO/RH: ABO/RH(D): O POS

## 2023-07-14 SURGERY — HYSTERECTOMY, VAGINAL, LAPAROSCOPY-ASSISTED, WITH SALPINGO-OOPHORECTOMY
Anesthesia: General | Site: Vagina

## 2023-07-14 MED ORDER — MEPERIDINE HCL 25 MG/ML IJ SOLN: 6.2500 mg | INTRAMUSCULAR | Status: DC | PRN

## 2023-07-14 MED ORDER — OXYCODONE-ACETAMINOPHEN 5-325 MG PO TABS: 1.0000 | ORAL_TABLET | ORAL | Status: DC | PRN

## 2023-07-14 MED ORDER — OXYCODONE HCL 5 MG PO TABS
ORAL_TABLET | ORAL | Status: AC
  Filled 2023-07-14: qty 1

## 2023-07-14 MED ORDER — LACTATED RINGERS IV SOLN: INTRAVENOUS | Status: DC | PRN

## 2023-07-14 MED ORDER — PROPOFOL 10 MG/ML IV BOLUS
INTRAVENOUS | Status: AC
  Filled 2023-07-14: qty 20

## 2023-07-14 MED ORDER — OXYCODONE HCL 5 MG/5ML PO SOLN: 5.0000 mg | Freq: Once | ORAL | Status: DC | PRN

## 2023-07-14 MED ORDER — ONDANSETRON HCL 4 MG PO TABS: 4.0000 mg | ORAL_TABLET | Freq: Four times a day (QID) | ORAL | Status: DC | PRN

## 2023-07-14 MED ORDER — ACETAMINOPHEN 160 MG/5ML PO SOLN: 325.0000 mg | ORAL | Status: DC | PRN

## 2023-07-14 MED ORDER — OXYCODONE HCL 5 MG PO TABS: 5.0000 mg | ORAL_TABLET | Freq: Once | ORAL | Status: DC | PRN

## 2023-07-14 MED ORDER — ONDANSETRON HCL 4 MG/2ML IJ SOLN: 4.0000 mg | Freq: Four times a day (QID) | INTRAMUSCULAR | Status: DC | PRN

## 2023-07-14 MED ORDER — OXYCODONE HCL 5 MG PO TABS
5.0000 mg | ORAL_TABLET | Freq: Once | ORAL | Status: AC
  Administered 2023-07-14: 5 mg via ORAL

## 2023-07-14 MED ORDER — KETOROLAC TROMETHAMINE 15 MG/ML IJ SOLN
15.0000 mg | Freq: Four times a day (QID) | INTRAMUSCULAR | Status: DC
  Administered 2023-07-14 – 2023-07-15 (×3): 15 mg via INTRAVENOUS

## 2023-07-14 MED ORDER — DEXAMETHASONE SODIUM PHOSPHATE 10 MG/ML IJ SOLN
INTRAMUSCULAR | Status: DC | PRN
  Administered 2023-07-14: 5 mg via INTRAVENOUS

## 2023-07-14 MED ORDER — SODIUM CHLORIDE 0.9% FLUSH: 9.0000 mL | INTRAVENOUS | Status: DC | PRN

## 2023-07-14 MED ORDER — PHENYLEPHRINE 80 MCG/ML (10ML) SYRINGE FOR IV PUSH (FOR BLOOD PRESSURE SUPPORT)
PREFILLED_SYRINGE | INTRAVENOUS | Status: AC
  Filled 2023-07-14: qty 10

## 2023-07-14 MED ORDER — PROPOFOL 10 MG/ML IV BOLUS
INTRAVENOUS | Status: DC | PRN
  Administered 2023-07-14: 120 mg via INTRAVENOUS

## 2023-07-14 MED ORDER — SODIUM CHLORIDE 0.9 % IR SOLN
Status: DC | PRN
  Administered 2023-07-14: 300 mL

## 2023-07-14 MED ORDER — CLINDAMYCIN PHOSPHATE 900 MG/50ML IV SOLN
INTRAVENOUS | Status: AC
Start: 2023-07-14 — End: ?
  Filled 2023-07-14: qty 50

## 2023-07-14 MED ORDER — PROPOFOL 10 MG/ML IV BOLUS
INTRAVENOUS | Status: AC
Start: 2023-07-14 — End: ?
  Filled 2023-07-14: qty 20

## 2023-07-14 MED ORDER — FENTANYL CITRATE (PF) 100 MCG/2ML IJ SOLN
25.0000 ug | INTRAMUSCULAR | Status: DC | PRN
Start: 2023-07-14 — End: 2023-07-14
  Administered 2023-07-14 (×3): 25 ug via INTRAVENOUS

## 2023-07-14 MED ORDER — NALOXONE HCL 0.4 MG/ML IJ SOLN: 0.4000 mg | INTRAMUSCULAR | Status: DC | PRN

## 2023-07-14 MED ORDER — KETOROLAC TROMETHAMINE 30 MG/ML IJ SOLN
INTRAMUSCULAR | Status: AC
  Filled 2023-07-14: qty 1

## 2023-07-14 MED ORDER — FLUORESCEIN SODIUM 10 % IV SOLN
INTRAVENOUS | Status: DC | PRN
  Administered 2023-07-14: 25 mg via INTRAVENOUS

## 2023-07-14 MED ORDER — FENTANYL CITRATE (PF) 100 MCG/2ML IJ SOLN
INTRAMUSCULAR | Status: AC
  Filled 2023-07-14: qty 2

## 2023-07-14 MED ORDER — SODIUM CHLORIDE 0.9 % IR SOLN
Status: DC | PRN
  Administered 2023-07-14: 500 mL via INTRAVESICAL

## 2023-07-14 MED ORDER — SODIUM CHLORIDE 0.9% FLUSH: 3.0000 mL | Freq: Two times a day (BID) | INTRAVENOUS | Status: DC

## 2023-07-14 MED ORDER — HYDROMORPHONE 1 MG/ML IV SOLN: INTRAVENOUS | Status: DC

## 2023-07-14 MED ORDER — ROCURONIUM BROMIDE 10 MG/ML (PF) SYRINGE
PREFILLED_SYRINGE | INTRAVENOUS | Status: AC
Start: 2023-07-14 — End: ?
  Filled 2023-07-14: qty 10

## 2023-07-14 MED ORDER — WHITE PETROLATUM EX OINT
TOPICAL_OINTMENT | CUTANEOUS | Status: AC
Start: 2023-07-14 — End: ?
  Filled 2023-07-14: qty 5

## 2023-07-14 MED ORDER — DEXAMETHASONE SODIUM PHOSPHATE 10 MG/ML IJ SOLN
INTRAMUSCULAR | Status: AC
Start: 2023-07-14 — End: ?
  Filled 2023-07-14: qty 1

## 2023-07-14 MED ORDER — ROCURONIUM BROMIDE 10 MG/ML (PF) SYRINGE
PREFILLED_SYRINGE | INTRAVENOUS | Status: DC | PRN
  Administered 2023-07-14: 50 mg via INTRAVENOUS

## 2023-07-14 MED ORDER — MIDAZOLAM HCL 2 MG/2ML IJ SOLN
INTRAMUSCULAR | Status: DC | PRN
  Administered 2023-07-14: 2 mg via INTRAVENOUS

## 2023-07-14 MED ORDER — MENTHOL 3 MG MT LOZG: 1.0000 | LOZENGE | OROMUCOSAL | Status: DC | PRN

## 2023-07-14 MED ORDER — SUGAMMADEX SODIUM 200 MG/2ML IV SOLN
INTRAVENOUS | Status: DC | PRN
  Administered 2023-07-14: 120 mg via INTRAVENOUS

## 2023-07-14 MED ORDER — GENTAMICIN SULFATE 40 MG/ML IJ SOLN
260.0000 mg | INTRAVENOUS | Status: AC
  Administered 2023-07-14: 260 mg via INTRAVENOUS
  Filled 2023-07-14: qty 6.5

## 2023-07-14 MED ORDER — ONDANSETRON HCL 4 MG/2ML IJ SOLN
INTRAMUSCULAR | Status: DC | PRN
  Administered 2023-07-14: 4 mg via INTRAVENOUS

## 2023-07-14 MED ORDER — KETOROLAC TROMETHAMINE 15 MG/ML IJ SOLN
INTRAMUSCULAR | Status: AC
  Filled 2023-07-14: qty 1

## 2023-07-14 MED ORDER — ACETAMINOPHEN 10 MG/ML IV SOLN
INTRAVENOUS | Status: AC
  Filled 2023-07-14: qty 100

## 2023-07-14 MED ORDER — KETOROLAC TROMETHAMINE 30 MG/ML IJ SOLN
INTRAMUSCULAR | Status: DC | PRN
  Administered 2023-07-14: 15 mg via INTRAVENOUS

## 2023-07-14 MED ORDER — CLINDAMYCIN PHOSPHATE 900 MG/50ML IV SOLN
900.0000 mg | INTRAVENOUS | Status: AC
  Administered 2023-07-14: 900 mg via INTRAVENOUS

## 2023-07-14 MED ORDER — HEMOSTATIC AGENTS (NO CHARGE) OPTIME
TOPICAL | Status: DC | PRN
  Administered 2023-07-14: 1 via TOPICAL

## 2023-07-14 MED ORDER — ONDANSETRON HCL 4 MG/2ML IJ SOLN: 4.0000 mg | Freq: Once | INTRAMUSCULAR | Status: DC | PRN

## 2023-07-14 MED ORDER — LIDOCAINE HCL (PF) 2 % IJ SOLN
INTRAMUSCULAR | Status: AC
  Filled 2023-07-14: qty 5

## 2023-07-14 MED ORDER — POVIDONE-IODINE 10 % EX SWAB: 2.0000 | Freq: Once | CUTANEOUS | Status: DC

## 2023-07-14 MED ORDER — OXYCODONE HCL 5 MG PO TABS
5.0000 mg | ORAL_TABLET | ORAL | Status: DC | PRN
  Administered 2023-07-14 – 2023-07-15 (×4): 5 mg via ORAL

## 2023-07-14 MED ORDER — STERILE WATER FOR IRRIGATION IR SOLN
Status: DC | PRN
  Administered 2023-07-14: 500 mL

## 2023-07-14 MED ORDER — PHENYLEPHRINE HCL (PRESSORS) 10 MG/ML IV SOLN
INTRAVENOUS | Status: DC | PRN
  Administered 2023-07-14 (×6): 80 ug via INTRAVENOUS

## 2023-07-14 MED ORDER — FENTANYL CITRATE (PF) 100 MCG/2ML IJ SOLN
INTRAMUSCULAR | Status: DC | PRN
  Administered 2023-07-14: 25 ug via INTRAVENOUS

## 2023-07-14 MED ORDER — ACETAMINOPHEN 10 MG/ML IV SOLN
1000.0000 mg | Freq: Once | INTRAVENOUS | Status: AC
  Administered 2023-07-14: 1000 mg via INTRAVENOUS

## 2023-07-14 MED ORDER — ACETAMINOPHEN 500 MG PO TABS
ORAL_TABLET | ORAL | Status: AC
Start: 2023-07-14 — End: ?
  Filled 2023-07-14: qty 2

## 2023-07-14 MED ORDER — LIDOCAINE 2% (20 MG/ML) 5 ML SYRINGE
INTRAMUSCULAR | Status: DC | PRN
  Administered 2023-07-14: 60 mg via INTRAVENOUS

## 2023-07-14 MED ORDER — DEXTROSE-SODIUM CHLORIDE 5-0.45 % IV SOLN: INTRAVENOUS | Status: DC

## 2023-07-14 MED ORDER — ACETAMINOPHEN 500 MG PO TABS: 1000.0000 mg | ORAL_TABLET | Freq: Once | ORAL | Status: DC

## 2023-07-14 MED ORDER — ONDANSETRON HCL 4 MG/2ML IJ SOLN
INTRAMUSCULAR | Status: AC
  Filled 2023-07-14: qty 2

## 2023-07-14 MED ORDER — DIPHENHYDRAMINE HCL 12.5 MG/5ML PO ELIX: 12.5000 mg | ORAL_SOLUTION | Freq: Four times a day (QID) | ORAL | Status: DC | PRN

## 2023-07-14 MED ORDER — BUPIVACAINE HCL (PF) 0.25 % IJ SOLN
INTRAMUSCULAR | Status: DC | PRN
  Administered 2023-07-14: 7 mL

## 2023-07-14 MED ORDER — MIDAZOLAM HCL 2 MG/2ML IJ SOLN
INTRAMUSCULAR | Status: AC
Start: 2023-07-14 — End: ?
  Filled 2023-07-14: qty 2

## 2023-07-14 MED ORDER — ACETAMINOPHEN 325 MG PO TABS: 325.0000 mg | ORAL_TABLET | ORAL | Status: DC | PRN

## 2023-07-14 MED ORDER — DIPHENHYDRAMINE HCL 50 MG/ML IJ SOLN: 12.5000 mg | Freq: Four times a day (QID) | INTRAMUSCULAR | Status: DC | PRN

## 2023-07-14 MED ORDER — LACTATED RINGERS IV SOLN: INTRAVENOUS | Status: DC

## 2023-07-14 SURGICAL SUPPLY — 79 items
APPLICATOR ARISTA FLEXITIP XL (MISCELLANEOUS) IMPLANT
BLADE CLIPPER SENSICLIP SURGIC (BLADE) IMPLANT
BLADE SURG 15 STRL LF DISP TIS (BLADE) ×5 IMPLANT
BNDG GAUZE DERMACEA FLUFF 4 (GAUZE/BANDAGES/DRESSINGS) IMPLANT
CATH ROBINSON RED A/P 16FR (CATHETERS) ×5 IMPLANT
COVER BACK TABLE 60X90IN (DRAPES) ×5 IMPLANT
COVER MAYO STAND STRL (DRAPES) ×10 IMPLANT
COVER SURGICAL LIGHT HANDLE (MISCELLANEOUS) IMPLANT
DERMABOND ADVANCED .7 DNX12 (GAUZE/BANDAGES/DRESSINGS) ×5 IMPLANT
DEVICE CAPIO SLIM SINGLE (INSTRUMENTS) IMPLANT
DRAPE SURG IRRIG POUCH 19X23 (DRAPES) ×5 IMPLANT
DRSG COVADERM PLUS 2X2 (GAUZE/BANDAGES/DRESSINGS) IMPLANT
DRSG OPSITE POSTOP 3X4 (GAUZE/BANDAGES/DRESSINGS) IMPLANT
DURAPREP 26ML APPLICATOR (WOUND CARE) ×5 IMPLANT
ELECT REM PT RETURN 9FT ADLT (ELECTROSURGICAL) ×3
ELECTRODE REM PT RTRN 9FT ADLT (ELECTROSURGICAL) ×5 IMPLANT
GAUZE 4X4 16PLY ~~LOC~~+RFID DBL (SPONGE) ×15 IMPLANT
GAUZE PACKING 1INX5YD STRL (GAUZE/BANDAGES/DRESSINGS) IMPLANT
GLOVE BIO SURGEON STRL SZ7 (GLOVE) ×20 IMPLANT
GLOVE ECLIPSE 6.5 STRL STRAW (GLOVE) ×5 IMPLANT
GOWN STRL REUS W/TWL XL LVL3 (GOWN DISPOSABLE) ×5 IMPLANT
HEMOSTAT ARISTA ABSORB 3G PWDR (HEMOSTASIS) IMPLANT
HOLDER FOLEY CATH W/STRAP (MISCELLANEOUS) ×5 IMPLANT
IV NS 1000ML BAXH (IV SOLUTION) IMPLANT
KIT PINK PAD W/HEAD ARE REST (MISCELLANEOUS) ×3
KIT PINK PAD W/HEAD ARM REST (MISCELLANEOUS) ×10 IMPLANT
KIT TURNOVER CYSTO (KITS) ×5 IMPLANT
NDL HYPO 22X1.5 SAFETY MO (MISCELLANEOUS) ×5 IMPLANT
NDL INSUFFLATION 14GA 120MM (NEEDLE) IMPLANT
NDL MAYO 6 CRC TAPER PT (NEEDLE) ×5 IMPLANT
NEEDLE HYPO 22X1.5 SAFETY MO (MISCELLANEOUS) ×3 IMPLANT
NEEDLE INSUFFLATION 14GA 120MM (NEEDLE) ×3 IMPLANT
NEEDLE MAYO 6 CRC TAPER PT (NEEDLE) ×3 IMPLANT
NS IRRIG 500ML POUR BTL (IV SOLUTION) ×5 IMPLANT
PACK LAVH (CUSTOM PROCEDURE TRAY) ×5 IMPLANT
PACK ROBOTIC GOWN (GOWN DISPOSABLE) ×5 IMPLANT
PACK VAGINAL WOMENS (CUSTOM PROCEDURE TRAY) ×5 IMPLANT
PAD OB MATERNITY 4.3X12.25 (PERSONAL CARE ITEMS) ×5 IMPLANT
PAD PREP 24X48 CUFFED NSTRL (MISCELLANEOUS) ×5 IMPLANT
SCISSORS LAP 5X45 EPIX DISP (ENDOMECHANICALS) IMPLANT
SEALER TISSUE G2 CVD JAW 45CM (ENDOMECHANICALS) ×5 IMPLANT
SET IRRIG Y TYPE TUR BLADDER L (SET/KITS/TRAYS/PACK) ×5 IMPLANT
SET TUBE SMOKE EVAC HIGH FLOW (TUBING) ×5 IMPLANT
SLEEVE SCD COMPRESS KNEE MED (STOCKING) ×5 IMPLANT
SOL ELECTROSURG ANTI STICK (MISCELLANEOUS)
SOLUTION ELECTROSURG ANTI STCK (MISCELLANEOUS) IMPLANT
SPIKE FLUID TRANSFER (MISCELLANEOUS) IMPLANT
SPONGE T-LAP 4X18 ~~LOC~~+RFID (SPONGE) ×5 IMPLANT
STRIP CLOSURE SKIN 1/4X4 (GAUZE/BANDAGES/DRESSINGS) IMPLANT
SUT CAPIO POLYGLYCOLIC (SUTURE) IMPLANT
SUT CHROMIC 2 0 SH (SUTURE) IMPLANT
SUT CHROMIC 3 0 SH 27 (SUTURE) ×5 IMPLANT
SUT GUT CHROMIC 3 0 (SUTURE) IMPLANT
SUT MON AB 3-0 SH27 (SUTURE) ×5 IMPLANT
SUT VIC AB 0 CT1 18XCR BRD8 (SUTURE) ×10 IMPLANT
SUT VIC AB 0 CT1 27XBRD ANBCTR (SUTURE) IMPLANT
SUT VIC AB 0 CT1 36 (SUTURE) ×10 IMPLANT
SUT VIC AB 2-0 CT1 (SUTURE) IMPLANT
SUT VIC AB 2-0 CT1 TAPERPNT 27 (SUTURE) IMPLANT
SUT VIC AB 2-0 CT2 27 (SUTURE) IMPLANT
SUT VIC AB 2-0 SH 27XBRD (SUTURE) ×5 IMPLANT
SUT VIC AB 2-0 UR6 27 (SUTURE) IMPLANT
SUT VIC AB 3-0 SH 27X BRD (SUTURE) ×5 IMPLANT
SUT VIC AB 4-0 PS2 18 (SUTURE) ×5 IMPLANT
SUT VIC AB CT1 27XBRD ANBCTRL (SUTURE) ×3
SUT VICRYL 0 UR6 27IN ABS (SUTURE) IMPLANT
SUT VICRYL 1 TIES 12X18 (SUTURE) ×5 IMPLANT
SYR BULB IRRIG 60ML STRL (SYRINGE) IMPLANT
SYS BAG RETRIEVAL 10MM (BASKET)
SYSTEM BAG RETRIEVAL 10MM (BASKET) IMPLANT
TOWEL OR 17X24 6PK STRL BLUE (TOWEL DISPOSABLE) ×5 IMPLANT
TRAY FOLEY W/BAG SLVR 14FR LF (SET/KITS/TRAYS/PACK) ×5 IMPLANT
TROCAR BALLN 12MMX100 BLUNT (TROCAR) IMPLANT
TROCAR Z-THREAD BLADED 11X100M (TROCAR) IMPLANT
TROCAR Z-THREAD BLADED 5X100MM (TROCAR) ×5 IMPLANT
TROCAR Z-THREAD FIOS 12X100MM (TROCAR) IMPLANT
TUBE CONNECTING 12X1/4 (SUCTIONS) IMPLANT
WARMER LAPAROSCOPE (MISCELLANEOUS) ×5 IMPLANT
WATER STERILE IRR 500ML POUR (IV SOLUTION) ×5 IMPLANT

## 2023-07-14 NOTE — H&P (Signed)
History and physical exam unchanged

## 2023-07-14 NOTE — Brief Op Note (Signed)
07/14/2023  11:09 AM  PATIENT:  Katie Brewer  62 y.o. female  PRE-OPERATIVE DIAGNOSIS:  FAMILY HISTORY OF OVARIAN CANCER WORSENING PELVIC RELAXATION  POST-OPERATIVE DIAGNOSIS:  FAMILY HISTORY OF OVARIAN CANCER WORSENING PELVIC RELAXATION  PROCEDURE:  Procedure(s): LAPAROSCOPIC ASSISTED VAGINAL HYSTERECTOMY WITH SALPINGO OOPHORECTOMY (Bilateral) ANTERIOR AND POSTERIOR REPAIR WITH SACROSPINOUS FIXATION (N/A) CYSTOSCOPY (N/A)  SURGEON:  Surgeons and Role:    * Richardean Chimera, MD - Primary    * Zelphia Cairo, MD - Assisting  PHYSICIAN ASSISTANT: Renaldo Fiddler  ASSISTANTS: adkins   ANESTHESIA:   local and general  EBL:  100 mL   BLOOD ADMINISTERED:none  DRAINS: Urinary Catheter (Foley)   LOCAL MEDICATIONS USED:  XYLOCAINE   SPECIMEN:  Source of Specimen:  uterus tubes and ovaries  DISPOSITION OF SPECIMEN:  PATHOLOGY  COUNTS:  YES  TOURNIQUET:  * No tourniquets in log *  DICTATION: .Other Dictation: Dictation Number [id number]  PLAN OF CARE: Admit for overnight observation  PATIENT DISPOSITION:  PACU - hemodynamically stable.   Delay start of Pharmacological VTE agent (>24hrs) due to surgical blood loss or risk of bleeding: no

## 2023-07-14 NOTE — Hospital Course (Signed)
Af Vss Abdomen soft Inc clear Minimal vaginal spotting Good uo Continue post op care

## 2023-07-14 NOTE — Progress Notes (Signed)
Prior to beginning IV in pre-op pt reported "hot flash" with some perspiration noted on hands. After inserting IV at 0615, pt began to report nausea and stated "I'm going to pass out". Foot of recliner was raised and pt encouraged to lean back. Pallor and diaphoresis noted. Pt visibly slumped in chair around 0617 and was unresponsive for about 20-30 seconds. Called for second RN. Vital signs obtained (see flowsheets). Cool rag and fan applied. Pt received ~250 mL of IVF in this time. Skin color returned to pink, cool and dry. A&Ox4. Pt reported nausea had passed and was feeling better.

## 2023-07-14 NOTE — Progress Notes (Signed)
Af VSS ABDOMEN SOFT INC CLEAR MINIMAL SPOTTING GOOD \\UO  DOING WELL POST OP

## 2023-07-14 NOTE — Brief Op Note (Signed)
07/14/2023  9:59 AM  PATIENT:  Katie Brewer  62 y.o. female  PRE-OPERATIVE DIAGNOSIS:  FAMILY HISTORY OF OVARIAN CANCER WORSENING PELVIC RELAXATION  POST-OPERATIVE DIAGNOSIS:  FAMILY HISTORY OF OVARIAN CANCER WORSENING PELVIC RELAXATION  PROCEDURE:  Procedure(s): LAPAROSCOPIC ASSISTED VAGINAL HYSTERECTOMY WITH SALPINGO OOPHORECTOMY (Bilateral) ANTERIOR AND POSTERIOR REPAIR WITH SACROSPINOUS FIXATION (N/A) CYSTOSCOPY (N/A)  SURGEON:  Surgeons and Role:    * Richardean Chimera, MD - Primary    * Zelphia Cairo, MD - Assisting  PHYSICIAN ASSISTANT:   ASSISTANTS: atkins   ANESTHESIA:   local and general  EBL:  100 mL   BLOOD ADMINISTERED:none  DRAINS: Urinary Catheter (Foley)   LOCAL MEDICATIONS USED:  XYLOCAINE   SPECIMEN:  Source of Specimen:  uterus tubes and ovaries  DISPOSITION OF SPECIMEN:  PATHOLOGY  COUNTS:  YES  TOURNIQUET:  * No tourniquets in log *  DICTATION: .Other Dictation: Dictation Number [id number]  PLAN OF CARE: Admit for overnight observation  PATIENT DISPOSITION:  PACU - hemodynamically stable.   Delay start of Pharmacological VTE agent (>24hrs) due to surgical blood loss or risk of bleeding: no

## 2023-07-14 NOTE — Transfer of Care (Signed)
Immediate Anesthesia Transfer of Care Note  Patient: Katie Brewer  Procedure(s) Performed: Procedure(s) (LRB): LAPAROSCOPIC ASSISTED VAGINAL HYSTERECTOMY WITH SALPINGO OOPHORECTOMY (Bilateral) ANTERIOR AND POSTERIOR REPAIR WITH SACROSPINOUS FIXATION (N/A) CYSTOSCOPY (N/A)  Patient Location: PACU  Anesthesia Type: General  Level of Consciousness: awake, oriented, sedated and patient cooperative  Airway & Oxygen Therapy: Patient Spontanous Breathing and Patient connected to face mask oxygen  Post-op Assessment: Report given to PACU RN and Post -op Vital signs reviewed and stable  Post vital signs: Reviewed and stable  Complications: No apparent anesthesia complications  Last Vitals:  Vitals Value Taken Time  BP 98/56 07/14/23 1000  Temp 37.1 C 07/14/23 1000  Pulse 94 07/14/23 1002  Resp 21 07/14/23 1003  SpO2 93 % 07/14/23 1002  Vitals shown include unfiled device data.  Last Pain:  Vitals:   07/14/23 0606  TempSrc: Oral  PainSc: 0-No pain      Patients Stated Pain Goal: 4 (07/14/23 0606)  Complications: No notable events documented.

## 2023-07-14 NOTE — Anesthesia Postprocedure Evaluation (Signed)
Anesthesia Post Note  Patient: OPHELIA QUILTY  Procedure(s) Performed: LAPAROSCOPIC ASSISTED VAGINAL HYSTERECTOMY WITH SALPINGO OOPHORECTOMY (Bilateral: Vagina ) ANTERIOR AND POSTERIOR REPAIR WITH SACROSPINOUS FIXATION (Vagina ) CYSTOSCOPY (Bladder)     Patient location during evaluation: PACU Anesthesia Type: General Level of consciousness: awake and alert Pain management: pain level controlled Vital Signs Assessment: post-procedure vital signs reviewed and stable Respiratory status: spontaneous breathing, nonlabored ventilation, respiratory function stable and patient connected to nasal cannula oxygen Cardiovascular status: blood pressure returned to baseline and stable Postop Assessment: no apparent nausea or vomiting Anesthetic complications: no   No notable events documented.  Last Vitals:  Vitals:   07/14/23 1135 07/14/23 1203  BP: 120/67 106/67  Pulse: 95 98  Resp: 15 14  Temp: 36.5 C 36.7 C  SpO2: 99% 98%    Last Pain:  Vitals:   07/14/23 1147  TempSrc:   PainSc: 4                  Levis Nazir

## 2023-07-14 NOTE — Anesthesia Procedure Notes (Signed)
Procedure Name: Intubation Date/Time: 07/14/2023 7:36 AM  Performed by: Francie Massing, CRNAPre-anesthesia Checklist: Patient identified, Emergency Drugs available, Suction available and Patient being monitored Patient Re-evaluated:Patient Re-evaluated prior to induction Oxygen Delivery Method: Circle system utilized Preoxygenation: Pre-oxygenation with 100% oxygen Induction Type: IV induction Ventilation: Mask ventilation without difficulty Laryngoscope Size: Mac and 3 Grade View: Grade I Tube type: Oral Tube size: 7.0 mm Number of attempts: 1 Airway Equipment and Method: Stylet and Oral airway Placement Confirmation: ETT inserted through vocal cords under direct vision, positive ETCO2 and breath sounds checked- equal and bilateral Secured at: 22 cm Tube secured with: Tape Dental Injury: Teeth and Oropharynx as per pre-operative assessment

## 2023-07-14 NOTE — Op Note (Signed)
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  Family history of ovarian cancer.  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  Family history of ovarian cancer.  PROCEDURE:  Laparoscopically-assisted vaginal hysterectomy with bilateral salpingo-oophorectomy, anterior and posterior repair, sacrospinous ligament suspension, cystoscopy.  SURGEON:  Juluis Mire, MD  ASSISTANTVickey Sages.  ANESTHESIA:  General endotracheal.  ESTIMATED BLOOD LOSS:  200 mL.  PACKS AND DRAINS:  Packs included a vaginal pack.  Drains urethral Foley.  INTRAOPERATIVE BLOOD REPLACEMENT:  None.  COMPLICATIONS:  None.  INDICATIONS:  As dictated in history and physical.  DESCRIPTION OF PROCEDURE:  The patient was taken to the OR and placed in the supine position.  After a satisfactory level of general endotracheal anesthesia was obtained, the patient was placed in the dorsal lithotomy position using the Allen stirrups.   The perineum and vagina were prepped out with DuraPrep.  Bladder was then drained by in and out catheterization.  A Hulka tenaculum was put in place and secured.  Abdomen was then prepped out.  After a satisfactory period of time, the patient was draped  as a sterile field.  A subumbilical incision was made with a knife and extended through the subcutaneous tissue.  Fascia was identified in a sharp incision in the fascia anterolaterally.  Rectus muscles were separated midline.  Peritoneum was entered  with blunt finger pressure.  An open laparoscopic trocar was put in place and secured.  Abdomen was inflated with carbon dioxide.  The laparoscope was  introduced.  Visualization did reveal numerous omental adhesions to the left lower quadrant.  We were  able to manipulate around these and get into the pelvis.  The uterus was elevated.  It was normal size and shape.  Both tubes and ovaries were unremarkable.  A 5-mm trocar was put in place in the suprapubic area under direct visualization.  First, the  right ovary was elevated.  Using the EnSeal, the right ovarian vascular was separated from the ovary along with the mesosalpinx from the tube.  Then, the right para ovarian tissue was cauterized and incised.  We then went to the left side.  The left  ovary was elevated.  Using the EnSeal, the left ovary was elevated.  Ovary was freed from the underlying tissue using the EnSeal.  This was continued up to the uterus where the tube and ovary were separated from its underlying tissue.  The uterine tissue  was then cauterized and incised.  We then went vaginally.  An incision was made around the cervix using the bipolar.  The bladder was dissected superiorly.  Cul-de-sac was entered sharply.  Paracervical tissue was clamped, cut, and suture ligated with 0  Vicryl.  Using a clamp, cut, and tie technique with suture ligature 0 Vicryl, the parametrium was serially separated from the size of the uterus.  Vesicouterine space was identified and entered sharply.  The uterus was then flipped.  Remaining tackled  pedicles were clamped and cut.  Uterus, tubes and ovaries were passed off the operative field.  Of note, uterus, tubes and ovaries appeared to be normal.  Held pedicles were secured with pre-tight 0 Vicryl and suture ligature of 0 Vicryl.  We had good  hemostasis.  Posterior vaginal cuff was then run with a running locking suture of 0 Vicryl.  We had good hemostasis.  Attention was now turned to the anterior repair.  The vaginal tissue was grasped with the Allis clamps.  We dissected the underlying vaginal mucosa from the bladder up to approximately 1 cm below  the urethral opening.  The vaginal tissue was then  excised.  We sharply and bluntly dissected the bladder from the overlying vaginal mucosa.  A pursestring suture was then put around the bladder to reduce the cystocele.  Vaginal tissue was then trimmed.  Vaginal mucosa was reapproximated in the midline  using interrupted sutures of 0 Vicryl.  The vaginal cuff was also brought together with 0 Vicryl.  We had good support and hemostasis.  Attention was now turned to the posterior repair.  A V incision was made in the perineal body and the skin was dissected up to the vaginal opening and excised.  Next, the vaginal mucosa was separated from the underlying perirectal fascia up to near the  vaginal top.  The vaginal mucosa was incised in the midline.  We had good separation of the vaginal mucosa from the underlying tissue.  We then identified the right uterosacral ligament.  Using the Capio, a suture was placed through the ligament.  It was  then secured to the top of the vaginal cuff and held.  We then reapproximated the vaginal mucosa at the top coming down to about halfway.  There was really no rectocele to repair.  Next, the uterosacral plication stitch was tied and we had good  elevation of the vaginal mucosa.  Vaginal mucosa was reapproximated to the vaginal opening.  Perineal body was then rebuilt with 0 Vicryl.  Using a running subarticular, the vaginal mucosa was reapproximated over the perineal body.  We had good  hemostasis.  Laparoscope was then reinduced at this point in time.  Visualization revealed minimal spotting.  We brought this on under control by bipolar cautery.  We did put Arista in at the vaginal cuff.  We had good hemostasis.  The abdomen was  deflated and reinflated.  Again, no bleeding was encountered.  At this point in time, the fascia and the periumbilical incision was closed with interrupted suture of 0 Vicryl.  The skin was closed with interrupted subcuticular 4-0 Vicryl.  The  suprapubic  incision was closed with Dermabond.  Cystoscope was then performed.  We had good urine output.  The bladder was completely clear with no injury.  We gave the patient fluorescein.  Green tinge urine was needed to come in from both ureteral orifices.   Foley was then placed a straight drain.  A vaginal pack was put in place.  The patient was taken out of the dorsal supine position.  Once alert and extubated, transferred to the recovery room in good condition.  Sponge, instrument, and needle counts were  correct by circulating nurse x2.   PUS D: 07/14/2023 11:04:26 am T: 07/14/2023 11:48:00 am  JOB: [id number]/ [id number]

## 2023-07-15 ENCOUNTER — Other Ambulatory Visit (HOSPITAL_COMMUNITY): Payer: Self-pay

## 2023-07-15 DIAGNOSIS — N8110 Cystocele, unspecified: Secondary | ICD-10-CM | POA: Diagnosis not present

## 2023-07-15 LAB — CBC
HCT: 36.8 % (ref 36.0–46.0)
Hemoglobin: 12.1 g/dL (ref 12.0–15.0)
MCH: 30 pg (ref 26.0–34.0)
MCHC: 32.9 g/dL (ref 30.0–36.0)
MCV: 91.1 fL (ref 80.0–100.0)
Platelets: 150 10*3/uL (ref 150–400)
RBC: 4.04 MIL/uL (ref 3.87–5.11)
RDW: 12.1 % (ref 11.5–15.5)
WBC: 9.7 10*3/uL (ref 4.0–10.5)
nRBC: 0 % (ref 0.0–0.2)

## 2023-07-15 LAB — SURGICAL PATHOLOGY

## 2023-07-15 MED ORDER — KETOROLAC TROMETHAMINE 15 MG/ML IJ SOLN
INTRAMUSCULAR | Status: AC
  Filled 2023-07-15: qty 1

## 2023-07-15 MED ORDER — OXYCODONE HCL 5 MG PO TABS
ORAL_TABLET | ORAL | Status: AC
  Filled 2023-07-15: qty 1

## 2023-07-15 NOTE — Discharge Summary (Signed)
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   PROCEDURE:  1.  Laparoscopically assisted vaginal hysterectomy with bilateral salpingo-oophorectomy.  2.  Anterior and posterior repair.  3.  Sacrospinous ligament suspension.  4.  Cystoscopy.   For complete history and physical, please see dictated note course in the hospital. The patient underwent the above-noted surgery. She did well postoperatively.   Her postop hemoglobin was 12.1. That morning, her pack was discontinued along with her Foley. After she voided, she was discharged home.  PHYSICAL EXAMINATION: At the time of discharge she was afebrile, vital signs stable. ABDOMEN:  Soft.  All incisions were clear.  She was having minimal vaginal spotting. She was tolerating a regular diet and had passed flatus.  COMPLICATIONS:  None were encountered during her stay in the hospital.   The patient is discharged home in stable condition.    DISPOSITION:  She is to avoid vaginal inserts including sex, douching or tampons.  She is to avoid heavy lifting.  Car driving should be limited.  She is discharged home on oxycodone as needed for pain.  She should call if there be any heavy bleeding,  nausea, vomiting, fever, excessive pain, signs and symptoms of deep venous thrombosis or pulmonary embolus. The patient is instructed in all these and discharged home.       [email or web address] D: 07/15/2023 8:22:08 am T: 07/15/2023 8:40:00 am  JOB: [id number]/ [id number]

## 2023-07-15 NOTE — Discharge Summary (Signed)
Patient name   Katie Brewer, Katie Brewer DICTATION# [id number]#[id number]  Richardean Chimera, MD 07/15/2023 8:23 AM

## 2023-07-15 NOTE — Progress Notes (Signed)
1 Day Post-Op Procedure(s) (LRB): LAPAROSCOPIC ASSISTED VAGINAL HYSTERECTOMY WITH SALPINGO OOPHORECTOMY (Bilateral) ANTERIOR AND POSTERIOR REPAIR WITH SACROSPINOUS FIXATION (N/A) CYSTOSCOPY (N/A)  Subjective: Patient reports tolerating PO and + flatus.    Objective: I have reviewed patient's vital signs and intake and output.  General: alert and no distress GI: soft, non-tender; bowel sounds normal; no masses,  no organomegaly Vaginal Bleeding: minimal  Assessment: s/p Procedure(s): LAPAROSCOPIC ASSISTED VAGINAL HYSTERECTOMY WITH SALPINGO OOPHORECTOMY (Bilateral) ANTERIOR AND POSTERIOR REPAIR WITH SACROSPINOUS FIXATION (N/A) CYSTOSCOPY (N/A): stable  Plan: Discharge home  LOS: 0 days    Richardean Chimera, MD 07/15/2023, 7:55 AM

## 2023-07-20 ENCOUNTER — Encounter (HOSPITAL_BASED_OUTPATIENT_CLINIC_OR_DEPARTMENT_OTHER): Payer: Self-pay | Admitting: Obstetrics and Gynecology

## 2023-10-19 ENCOUNTER — Other Ambulatory Visit: Payer: Self-pay | Admitting: Obstetrics and Gynecology

## 2023-10-19 DIAGNOSIS — R928 Other abnormal and inconclusive findings on diagnostic imaging of breast: Secondary | ICD-10-CM

## 2023-10-31 ENCOUNTER — Ambulatory Visit
Admission: RE | Admit: 2023-10-31 | Discharge: 2023-10-31 | Disposition: A | Discharge status: Home / Self Care | Source: Ambulatory Visit | Attending: Obstetrics and Gynecology | Admitting: Obstetrics and Gynecology

## 2023-10-31 DIAGNOSIS — R928 Other abnormal and inconclusive findings on diagnostic imaging of breast: Secondary | ICD-10-CM

## 2024-03-11 ENCOUNTER — Ambulatory Visit (HOSPITAL_BASED_OUTPATIENT_CLINIC_OR_DEPARTMENT_OTHER): Admitting: Family

## 2024-03-11 VITALS — BP 138/70 | HR 102 | Ht 62.5 in | Wt 112.7 lb

## 2024-03-11 DIAGNOSIS — I341 Nonrheumatic mitral (valve) prolapse: Secondary | ICD-10-CM | POA: Diagnosis not present

## 2024-03-11 DIAGNOSIS — R002 Palpitations: Secondary | ICD-10-CM

## 2024-03-11 DIAGNOSIS — I2510 Atherosclerotic heart disease of native coronary artery without angina pectoris: Secondary | ICD-10-CM

## 2024-03-11 DIAGNOSIS — E785 Hyperlipidemia, unspecified: Secondary | ICD-10-CM | POA: Diagnosis not present

## 2024-03-11 NOTE — Progress Notes (Signed)
 Cardiology Office Note   Date:  03/16/2024  ID:  Katie, Brewer 1960-10-05, MRN [id number] PCP: Patient, No Pcp Per  [facility] Providers Cardiologist:  Shelda Bruckner, MD     History of Present Illness Katie Brewer is a 63 y.o. female with history of nonobstructive CAD, aortic atherosclerosis, for follow-up, syncope,Hashimoto's thyroiditis, anorexia.  Coronary CTA 01/2023 calcium score 51 with aortic atherosclerosis.  Last seen 04/2023 with sinus tachycardia and normal orthostatics.  She was recommended to increase hydration, wear compression stockings, gradual increase in activity.  Updated echo 05/27/2023 normal LVEF 60 to 85%, no RWMA, grade 1 diastolic dysfunction, mild prolapse of anterior leaflet of mitral valve. Statin therapy discussed but she was to consider and let us  know if she opted to start.  Presents today for follow up independently. She notes sensation of skipped beat palpitations sometimes feels likke a Engineer, manufacturing systems and will see her heart jump. Requests an updated monitor. Resting heart rate in the 90s which was higher than her usual. She has started magnesium with ashwaganda with lemon balm which she takes prior to bed. Since then average heart rate has dropped down to the 80s and it is helping with her sleep. She notes her right ankle swells but her left ankle does not. Wakes with it swollen but it does increase throughout the day. Ongoing for 2 years. No chest pain, exertional dyspnea. Walking regularly for exercise.  ROS: Please see the history of present illness.    All other systems reviewed and are negative.   Studies Reviewed      Cardiac Studies & Procedures   ______________________________________________________________________________________________   STRESS TESTS  EXERCISE TOLERANCE TEST (ETT) 02/11/2017  Interpretation Summary  Blood pressure demonstrated a normal response to exercise.  There was no ST segment deviation  noted during stress.  No evidence for ischemia by ST segment analysis.   ECHOCARDIOGRAM  ECHOCARDIOGRAM COMPLETE 05/27/2023  Narrative ECHOCARDIOGRAM REPORT    Patient Name:   Katie Brewer Date of Exam: 05/27/2023 Medical Rec #:  [id number]          Height:       62.0 in Accession #:    [id number]         Weight:       114.8 lb Date of Birth:  05-30-1961          BSA:          1.510 m Patient Age:    62 years           BP:           145/70 mmHg Patient Gender: F                  HR:           96 bpm. Exam Location:  Outpatient  Procedure: 2D Echo, 3D Echo, Cardiac Doppler, Color Doppler and Strain Analysis  Indications:    Mitral valve disorder  History:        Patient has prior history of Echocardiogram examinations, most recent 07/06/2017. Risk Factors:Non-Smoker. Hashimoto's thyroiditis; Palpiations.  Sonographer:    Orvil Holmes RDCS Referring Phys: [id number] BRIDGETTE CHRISTOPHER  IMPRESSIONS   1. Left ventricular ejection fraction, by estimation, is 60 to 65%. The left ventricle has normal function. The left ventricle has no regional wall motion abnormalities. Left ventricular diastolic parameters are consistent with Grade I diastolic dysfunction (impaired relaxation). 2. Right ventricular systolic function is normal. The right ventricular size is normal.  There is normal pulmonary artery systolic pressure. 3. The mitral valve is normal in structure. No evidence of mitral valve regurgitation. No evidence of mitral stenosis. There is mild prolapse of anterior leaflet of the mitral valve. 4. The aortic valve is tricuspid. Aortic valve regurgitation is not visualized. No aortic stenosis is present. 5. The inferior vena cava is normal in size with greater than 50% respiratory variability, suggesting right atrial pressure of 3 mmHg.  FINDINGS Left Ventricle: Left ventricular ejection fraction, by estimation, is 60 to 65%. The left ventricle has normal function. The left  ventricle has no regional wall motion abnormalities. Global longitudinal strain performed but not reported based on interpreter judgement due to suboptimal tracking. The left ventricular internal cavity size was normal in size. There is no left ventricular hypertrophy. Left ventricular diastolic parameters are consistent with Grade I diastolic dysfunction (impaired relaxation). Indeterminate filling pressures.  Right Ventricle: The right ventricular size is normal. No increase in right ventricular wall thickness. Right ventricular systolic function is normal. There is normal pulmonary artery systolic pressure. The tricuspid regurgitant velocity is 2.20 m/s, and with an assumed right atrial pressure of 3 mmHg, the estimated right ventricular systolic pressure is 22.4 mmHg.  Left Atrium: Left atrial size was normal in size.  Right Atrium: Right atrial size was normal in size.  Pericardium: There is no evidence of pericardial effusion.  Mitral Valve: The mitral valve is normal in structure. There is mild prolapse of anterior leaflet of the mitral valve. No evidence of mitral valve regurgitation. No evidence of mitral valve stenosis.  Tricuspid Valve: The tricuspid valve is normal in structure. Tricuspid valve regurgitation is trivial. No evidence of tricuspid stenosis.  Aortic Valve: The aortic valve is tricuspid. Aortic valve regurgitation is not visualized. No aortic stenosis is present.  Pulmonic Valve: The pulmonic valve was normal in structure. Pulmonic valve regurgitation is not visualized. No evidence of pulmonic stenosis.  Aorta: The aortic root is normal in size and structure.  Venous: The inferior vena cava is normal in size with greater than 50% respiratory variability, suggesting right atrial pressure of 3 mmHg.  IAS/Shunts: No atrial level shunt detected by color flow Doppler.   LEFT VENTRICLE PLAX 2D LVIDd:         3.94 cm   Diastology LVIDs:         2.37 cm   LV e' medial:     6.09 cm/s LV PW:         0.78 cm   LV E/e' medial:  11.0 LV IVS:        0.58 cm   LV e' lateral:   8.05 cm/s LVOT diam:     1.80 cm   LV E/e' lateral: 8.3 LV SV:         33 LV SV Index:   22 LVOT Area:     2.54 cm  3D Volume EF: 3D EF:        56 % LV EDV:       100 ml LV ESV:       45 ml LV SV:        56 ml  RIGHT VENTRICLE RV Basal diam:  3.15 cm RV Mid diam:    1.98 cm RV S prime:     13.40 cm/s TAPSE (M-mode): 2.0 cm  LEFT ATRIUM           Index        RIGHT ATRIUM  Index LA diam:      3.00 cm 1.99 cm/m   RA Area:     12.80 cm LA Vol (A2C): 19.3 ml 12.78 ml/m  RA Volume:   28.20 ml  18.68 ml/m LA Vol (A4C): 30.9 ml 20.46 ml/m AORTIC VALVE LVOT Vmax:   81.20 cm/s LVOT Vmean:  53.500 cm/s LVOT VTI:    0.130 m  AORTA Ao Root diam: 2.80 cm Ao Asc diam:  2.90 cm  MITRAL VALVE               TRICUSPID VALVE MV Area (PHT): 5.02 cm    TR Peak grad:   19.4 mmHg MV Decel Time: 151 msec    TR Vmax:        220.00 cm/s MV E velocity: 67.10 cm/s MV A velocity: 77.00 cm/s  SHUNTS MV E/A ratio:  0.87        Systemic VTI:  0.13 m Systemic Diam: 1.80 cm  Annabella Scarce MD Electronically signed by Annabella Scarce MD Signature Date/Time: 05/27/2023/3:36:26 PM    Final    MONITORS  CARDIAC EVENT MONITOR 02/24/2018  Narrative Indication  palpitations:  Duration: 30 d  Findings  80 symptom activated events Symptoms of rapid heart beat//Lightheadedness ass with sinus tach HR 105-134 Symptoms of syncope assoc with SInus rhythm 114 bpm Symptoms of flutter assoc with sinus rhythm APACs   No significant arrhythmia noted Sinus tach seen as expected Rare PAC   CT SCANS  CT CARDIAC SCORING (SELF PAY ONLY) 01/28/2023  Addendum 02/06/2023 11:19 AM ADDENDUM REPORT: 02/06/2023 11:17  EXAM: OVER-READ INTERPRETATION  CT CHEST  The following report is an over-read performed by radiologist Dr. Andrea Gasman of [facility] on 02/06/2023.  This over-read does not include interpretation of cardiac or coronary anatomy or pathology. The coronary calcium score interpretation by the cardiologist is attached.  COMPARISON:  None.  FINDINGS: Vascular: Mild aortic atherosclerosis. The included aorta is normal in caliber.  Mediastinum/nodes: No adenopathy or mass.  Small hiatal hernia.  Lungs: No focal airspace disease. No pulmonary nodule. No pleural fluid. The included airways are patent.  Upper abdomen: No acute or unexpected findings.  Musculoskeletal: There are no acute or suspicious osseous abnormalities.  IMPRESSION: 1. Small hiatal hernia. 2.  Aortic Atherosclerosis (ICD10-I70.0).   Electronically Signed By: Andrea Gasman M.D. On: 02/06/2023 11:17  Narrative CLINICAL DATA:  Cardiovascular Disease Risk stratification  EXAM: Coronary Calcium Score  TECHNIQUE: A gated, non-contrast computed tomography scan of the heart was performed using 3mm slice thickness. Axial images were analyzed on a dedicated workstation. Calcium scoring of the coronary arteries was performed using the Agatston method.  FINDINGS: Coronary arteries: Normal origins.  Coronary Calcium Score:  Left main: 0  Left anterior descending artery: 40  Left circumflex artery: 1  Right coronary artery: 11  Total: 51  Percentile: 76th  Pericardium: Normal.  Aorta: Normal caliber of ascending aorta. Aortic atherosclerosis noted.  Non-cardiac: See separate report from [facility].  IMPRESSION: Coronary calcium score of 51. This was 76th percentile for age-, race-, and sex-matched controls. Aortic atherosclerosis noted.  RECOMMENDATIONS: Coronary artery calcium (CAC) score is a strong predictor of incident coronary heart disease (CHD) and provides predictive information beyond traditional risk factors. CAC scoring is reasonable to use in the decision to withhold, postpone, or initiate statin therapy in  intermediate-risk or selected borderline-risk asymptomatic adults (age 62-75 years and LDL-C >=70 to <190 mg/dL) who do not have diabetes or established atherosclerotic cardiovascular  disease (ASCVD).* In intermediate-risk (10-year ASCVD risk >=7.5% to <20%) adults or selected borderline-risk (10-year ASCVD risk >=5% to <7.5%) adults in whom a CAC score is measured for the purpose of making a treatment decision the following recommendations have been made:  If CAC=0, it is reasonable to withhold statin therapy and reassess in 5 to 10 years, as long as higher risk conditions are absent (diabetes mellitus, family history of premature CHD in first degree relatives (males <55 years; females <65 years), cigarette smoking, or LDL >=190 mg/dL).  If CAC is 1 to 99, it is reasonable to initiate statin therapy for patients >=28 years of age.  If CAC is >=100 or >=75th percentile, it is reasonable to initiate statin therapy at any age.  Cardiology referral should be considered for patients with CAC scores >=400 or >=75th percentile.  *2018 AHA/ACC/AACVPR/AAPA/ABC/ACPM/ADA/AGS/APhA/ASPC/NLA/PCNA Guideline on the Management of Blood Cholesterol: A Report of the American College of Cardiology/American Heart Association Task Force on Clinical Practice Guidelines. J Am Coll Cardiol. 2019;73(24):3168-3209.  Shelda Bruckner, MD  Electronically Signed: By: Shelda Bruckner M.D. On: 02/02/2023 11:37     ______________________________________________________________________________________________      Risk Assessment/Calculations          Physical Exam VS:  BP 138/70   Pulse (!) 102   Ht 5' 2.5 (1.588 m)   Wt 112 lb 11.2 oz (51.1 kg)   LMP 06/06/2009   SpO2 98%   BMI 20.28 kg/m  Vitals:   03/11/24 1323 03/11/24 1353  BP: (!) 150/100 138/70  Pulse: (!) 102   Height: 5' 2.5 (1.588 m)   Weight: 112 lb 11.2 oz (51.1 kg)   SpO2: 98%   BMI (Calculated): 20.27           Wt Readings from Last 3 Encounters:  03/11/24 112 lb 11.2 oz (51.1 kg)  07/14/23 114 lb 3.2 oz (51.8 kg)  04/27/23 114 lb 12.8 oz (52.1 kg)    GEN: Well nourished, well developed in no acute distress NECK: No JVD; No carotid bruits CARDIAC: RRR, no murmurs, rubs, gallops RESPIRATORY:  Clear to auscultation without rales, wheezing or rhonchi  ABDOMEN: Soft, non-tender, non-distended EXTREMITIES:  No edema; No deformity   ASSESSMENT AND PLAN  Palpitations - notes more palpitations, will plan for 14 day ZIO monitor. Recommend avoidance of caffeine/etoh, managing stress well, staying well hydrated to prevent palpitations. Previously felt lethargic on metoprolol.   Mild MVP - by echo 02/2023. Monitor as clinically indicated with echocardiogram.   Pulsatile tinnitus - has seen ENT. Overall intermittent.   Nonobstructive CAD / HLD - Stable with no anginal symptoms. No indication for ischemic evaluation.  Has previously declined statin to manage lipids with diet/exercise. Recommend aiming for 150 minutes of moderate intensity activity per week and following a heart healthy diet.         Dispo: follow up in 1 year with Dr. Bruckner  Signed, Reche GORMAN Finder, NP

## 2024-03-11 NOTE — Patient Instructions (Addendum)
 Medication Instructions:   Your physician recommends that you continue on your current medications as directed. Please refer to the Current Medication list given to you today.   *If you need a refill on your cardiac medications before your next appointment, please call your pharmacy*  Lab Work:  None ordered.  If you have labs (blood work) drawn today and your tests are completely normal, you will receive your results only by: MyChart Message (if you have MyChart) OR A paper copy in the mail If you have any lab test that is abnormal or we need to change your treatment, we will call you to review the results.  Testing/Procedures:  GEOFFRY HEWS- Long Term Monitor Instructions  Your physician has requested you wear a ZIO patch monitor for 7 days.  This is a single patch monitor. [employer] supplies one patch monitor per enrollment. Additional stickers are not available. Please do not apply patch if you will be having a Nuclear Stress Test,  Echocardiogram, Cardiac CT, MRI, or Chest Xray during the period you would be wearing the  monitor. The patch cannot be worn during these tests. You cannot remove and re-apply the  ZIO XT patch monitor.  Your ZIO patch monitor will be mailed 3 day USPS to your address on file. It may take 3-5 days  to receive your monitor after you have been enrolled.  Once you have received your monitor, please review the enclosed instructions. Your monitor  has already been registered assigning a specific monitor serial # to you.  Billing and Patient Assistance Program Information  We have supplied [employer] with any of your insurance information on file for billing purposes. [employer] offers a sliding scale Patient Assistance Program for patients that do not have  insurance, or whose insurance does not completely cover the cost of the ZIO monitor.  You must apply for the Patient Assistance Program to qualify for this discounted rate.  To apply, please call [employer] at  [phone], select option 4, select option 2, ask to apply for  Patient Assistance Program. Meredeth will ask your household income, and how many people  are in your household. They will quote your out-of-pocket cost based on that information.  [employer] will also be able to set up a 44-month, interest-free payment plan if needed.  Applying the monitor   Shave hair from upper left chest.  Hold abrader disc by orange tab. Rub abrader in 40 strokes over the upper left chest as  indicated in your monitor instructions.  Clean area with 4 enclosed alcohol pads. Let dry.  Apply patch as indicated in monitor instructions. Patch will be placed under collarbone on left  side of chest with arrow pointing upward.  Rub patch adhesive wings for 2 minutes. Remove white label marked 1. Remove the white  label marked 2. Rub patch adhesive wings for 2 additional minutes.  While looking in a mirror, press and release button in center of patch. A small green light will  flash 3-4 times. This will be your only indicator that the monitor has been turned on.  Do not shower for the first 24 hours. You may shower after the first 24 hours.  Press the button if you feel a symptom. You will hear a small click. Record Date, Time and  Symptom in the Patient Logbook.  When you are ready to remove the patch, follow instructions on the last 2 pages of Patient  Logbook. Stick patch monitor onto the last page of Patient Logbook.  Place Patient Logbook in the blue and white box. Use locking tab on box and tape box closed  securely. The blue and white box has prepaid postage on it. Please place it in the mailbox as  soon as possible. Your physician should have your test results approximately 7 days after the  monitor has been mailed back to [facility].  Call [facility] Customer Care at [phone] if you have questions regarding  your ZIO XT patch monitor. Call them immediately if you see an orange light  blinking on your  monitor.  If your monitor falls off in less than 4 days, contact our Monitor department at [phone].  If your monitor becomes loose or falls off after 4 days call [employer] at [phone] for  suggestions on securing your monitor   Follow-Up: At [facility], you and your health needs are our priority.  As part of our continuing mission to provide you with exceptional heart care, our providers are all part of one team.  This team includes your primary Cardiologist (physician) and Advanced Practice Providers or APPs (Physician Assistants and Nurse Practitioners) who all work together to provide you with the care you need, when you need it.  Your next appointment:   1 year(s)  Provider:   Reche Finder, NP    We recommend signing up for the patient portal called MyChart.  Sign up information is provided on this After Visit Summary.  MyChart is used to connect with patients for Virtual Visits (Telemedicine).  Patients are able to view lab/test results, encounter notes, upcoming appointments, etc.  Non-urgent messages can be sent to your provider as well.   To learn more about what you can do with MyChart, go to [email or web address].   Other Instructions  Your physician wants you to follow-up in: 1 year.  You will receive a reminder letter in the mail two months in advance. If you don't receive a letter, please call our office to schedule the follow-up appointment.        Consider checking CBC (hemoglobin), potassium, and magnesium with your endocrinologist in September.   Heart Healthy Diet Recommendations: A low-salt diet is recommended. Meats should be grilled, baked, or boiled. Avoid fried foods. Focus on lean protein sources like fish or chicken with vegetables and fruits. The American Heart Association is a Chief Technology Officer!  American Heart Association Diet and Lifeystyle Recommendations   Exercise recommendations: The American Heart Association  recommends 150 minutes of moderate intensity exercise weekly. Try 30 minutes of moderate intensity exercise 4-5 times per week. This could include walking, jogging, or swimming.

## 2024-03-14 ENCOUNTER — Ambulatory Visit: Attending: Family

## 2024-03-14 ENCOUNTER — Other Ambulatory Visit (HOSPITAL_BASED_OUTPATIENT_CLINIC_OR_DEPARTMENT_OTHER): Payer: Self-pay | Admitting: Family

## 2024-03-14 DIAGNOSIS — R002 Palpitations: Secondary | ICD-10-CM

## 2024-03-14 DIAGNOSIS — I341 Nonrheumatic mitral (valve) prolapse: Secondary | ICD-10-CM

## 2024-03-14 NOTE — Progress Notes (Unsigned)
 Enrolled for [employer] to mail a ZIO XT long term holter monitor to the patients address on file.   Dr. Jodelle Red to read.

## 2024-03-16 ENCOUNTER — Encounter (HOSPITAL_BASED_OUTPATIENT_CLINIC_OR_DEPARTMENT_OTHER): Payer: Self-pay | Admitting: Family

## 2024-05-09 ENCOUNTER — Encounter (HOSPITAL_BASED_OUTPATIENT_CLINIC_OR_DEPARTMENT_OTHER): Payer: Self-pay

## 2024-05-25 DIAGNOSIS — R002 Palpitations: Secondary | ICD-10-CM

## 2024-05-25 DIAGNOSIS — I341 Nonrheumatic mitral (valve) prolapse: Secondary | ICD-10-CM

## 2024-05-26 ENCOUNTER — Ambulatory Visit (HOSPITAL_BASED_OUTPATIENT_CLINIC_OR_DEPARTMENT_OTHER): Payer: Self-pay | Admitting: Family

## 2024-08-29 ENCOUNTER — Encounter: Payer: Self-pay | Admitting: *Deleted
